# Patient Record
Sex: Female | Born: 1975 | Race: Black or African American | Hispanic: No | Marital: Married | Smoking: Never smoker
Health system: Southern US, Community
[De-identification: ages and names within clinical notes are randomized; demographics above are authoritative.]

## PROBLEM LIST (undated history)

## (undated) DIAGNOSIS — I10 Essential (primary) hypertension: Secondary | ICD-10-CM

## (undated) DIAGNOSIS — G43909 Migraine, unspecified, not intractable, without status migrainosus: Secondary | ICD-10-CM

---

## 2000-03-23 ENCOUNTER — Encounter: Payer: Self-pay | Admitting: Emergency Medicine

## 2000-03-23 ENCOUNTER — Emergency Department (HOSPITAL_COMMUNITY): Admission: EM | Admit: 2000-03-23 | Discharge: 2000-03-23 | Payer: Self-pay | Admitting: Emergency Medicine

## 2000-04-01 ENCOUNTER — Emergency Department (HOSPITAL_COMMUNITY): Admission: EM | Admit: 2000-04-01 | Discharge: 2000-04-01 | Payer: Self-pay

## 2000-06-22 ENCOUNTER — Ambulatory Visit (HOSPITAL_COMMUNITY): Admission: RE | Admit: 2000-06-22 | Discharge: 2000-06-22 | Payer: Self-pay | Admitting: *Deleted

## 2000-08-08 ENCOUNTER — Ambulatory Visit (HOSPITAL_COMMUNITY): Admission: RE | Admit: 2000-08-08 | Discharge: 2000-08-08 | Payer: Self-pay | Admitting: *Deleted

## 2000-09-29 ENCOUNTER — Inpatient Hospital Stay (HOSPITAL_COMMUNITY): Admission: AD | Admit: 2000-09-29 | Discharge: 2000-09-29 | Payer: Self-pay | Admitting: *Deleted

## 2001-01-03 ENCOUNTER — Inpatient Hospital Stay (HOSPITAL_COMMUNITY): Admission: AD | Admit: 2001-01-03 | Discharge: 2001-01-05 | Payer: Self-pay | Admitting: Obstetrics

## 2001-11-24 ENCOUNTER — Emergency Department (HOSPITAL_COMMUNITY): Admission: EM | Admit: 2001-11-24 | Discharge: 2001-11-24 | Payer: Self-pay | Admitting: Emergency Medicine

## 2001-11-24 ENCOUNTER — Encounter: Payer: Self-pay | Admitting: Emergency Medicine

## 2003-02-09 ENCOUNTER — Emergency Department (HOSPITAL_COMMUNITY): Admission: EM | Admit: 2003-02-09 | Discharge: 2003-02-09 | Payer: Self-pay | Admitting: Emergency Medicine

## 2003-02-09 ENCOUNTER — Encounter: Payer: Self-pay | Admitting: Emergency Medicine

## 2003-03-20 ENCOUNTER — Other Ambulatory Visit: Admission: RE | Admit: 2003-03-20 | Discharge: 2003-03-20 | Payer: Self-pay | Admitting: Family Medicine

## 2003-11-12 ENCOUNTER — Emergency Department (HOSPITAL_COMMUNITY): Admission: EM | Admit: 2003-11-12 | Discharge: 2003-11-12 | Payer: Self-pay | Admitting: Emergency Medicine

## 2003-11-14 ENCOUNTER — Emergency Department (HOSPITAL_COMMUNITY): Admission: EM | Admit: 2003-11-14 | Discharge: 2003-11-14 | Payer: Self-pay | Admitting: Emergency Medicine

## 2004-02-04 ENCOUNTER — Ambulatory Visit (HOSPITAL_COMMUNITY): Admission: RE | Admit: 2004-02-04 | Discharge: 2004-02-04 | Payer: Self-pay | Admitting: Obstetrics

## 2005-01-25 ENCOUNTER — Ambulatory Visit (HOSPITAL_COMMUNITY): Admission: RE | Admit: 2005-01-25 | Discharge: 2005-01-25 | Payer: Self-pay | Admitting: Obstetrics & Gynecology

## 2005-02-16 ENCOUNTER — Ambulatory Visit (HOSPITAL_COMMUNITY): Admission: RE | Admit: 2005-02-16 | Discharge: 2005-02-16 | Payer: Self-pay | Admitting: Obstetrics & Gynecology

## 2005-02-19 ENCOUNTER — Inpatient Hospital Stay (HOSPITAL_COMMUNITY): Admission: AD | Admit: 2005-02-19 | Discharge: 2005-02-19 | Payer: Self-pay | Admitting: Psychiatry

## 2005-05-12 ENCOUNTER — Inpatient Hospital Stay (HOSPITAL_COMMUNITY): Admission: AD | Admit: 2005-05-12 | Discharge: 2005-05-12 | Payer: Self-pay | Admitting: Obstetrics

## 2005-05-19 ENCOUNTER — Encounter (HOSPITAL_COMMUNITY): Admission: RE | Admit: 2005-05-19 | Discharge: 2005-05-19 | Payer: Self-pay | Admitting: Obstetrics

## 2005-05-24 ENCOUNTER — Inpatient Hospital Stay (HOSPITAL_COMMUNITY): Admission: AD | Admit: 2005-05-24 | Discharge: 2005-06-01 | Payer: Self-pay | Admitting: Obstetrics & Gynecology

## 2005-05-30 ENCOUNTER — Encounter (INDEPENDENT_AMBULATORY_CARE_PROVIDER_SITE_OTHER): Payer: Self-pay | Admitting: Specialist

## 2006-04-04 ENCOUNTER — Emergency Department (HOSPITAL_COMMUNITY): Admission: EM | Admit: 2006-04-04 | Discharge: 2006-04-04 | Payer: Self-pay | Admitting: Emergency Medicine

## 2006-07-30 ENCOUNTER — Emergency Department (HOSPITAL_COMMUNITY): Admission: EM | Admit: 2006-07-30 | Discharge: 2006-07-30 | Payer: Self-pay | Admitting: Emergency Medicine

## 2006-09-19 ENCOUNTER — Ambulatory Visit (HOSPITAL_COMMUNITY): Admission: RE | Admit: 2006-09-19 | Discharge: 2006-09-19 | Payer: Self-pay | Admitting: Obstetrics

## 2006-10-14 ENCOUNTER — Inpatient Hospital Stay (HOSPITAL_COMMUNITY): Admission: RE | Admit: 2006-10-14 | Discharge: 2006-10-15 | Payer: Self-pay | Admitting: Obstetrics

## 2007-01-01 ENCOUNTER — Inpatient Hospital Stay (HOSPITAL_COMMUNITY): Admission: AD | Admit: 2007-01-01 | Discharge: 2007-01-01 | Payer: Self-pay | Admitting: Obstetrics

## 2007-01-10 ENCOUNTER — Inpatient Hospital Stay (HOSPITAL_COMMUNITY): Admission: AD | Admit: 2007-01-10 | Discharge: 2007-01-12 | Payer: Self-pay | Admitting: Obstetrics

## 2007-02-15 ENCOUNTER — Inpatient Hospital Stay (HOSPITAL_COMMUNITY): Admission: AD | Admit: 2007-02-15 | Discharge: 2007-02-20 | Payer: Self-pay | Admitting: Obstetrics

## 2007-02-28 ENCOUNTER — Inpatient Hospital Stay (HOSPITAL_COMMUNITY): Admission: AD | Admit: 2007-02-28 | Discharge: 2007-02-28 | Payer: Self-pay | Admitting: Obstetrics

## 2007-03-04 ENCOUNTER — Inpatient Hospital Stay (HOSPITAL_COMMUNITY): Admission: AD | Admit: 2007-03-04 | Discharge: 2007-03-08 | Payer: Self-pay | Admitting: Obstetrics & Gynecology

## 2007-03-05 ENCOUNTER — Encounter: Payer: Self-pay | Admitting: Obstetrics & Gynecology

## 2008-01-23 ENCOUNTER — Inpatient Hospital Stay (HOSPITAL_COMMUNITY): Admission: AD | Admit: 2008-01-23 | Discharge: 2008-01-23 | Payer: Self-pay | Admitting: Obstetrics & Gynecology

## 2008-06-04 ENCOUNTER — Other Ambulatory Visit: Admission: RE | Admit: 2008-06-04 | Discharge: 2008-06-04 | Payer: Self-pay | Admitting: Family Medicine

## 2010-01-01 ENCOUNTER — Other Ambulatory Visit: Admission: RE | Admit: 2010-01-01 | Discharge: 2010-01-01 | Payer: Self-pay | Admitting: Family Medicine

## 2010-08-24 ENCOUNTER — Emergency Department (HOSPITAL_COMMUNITY)
Admission: EM | Admit: 2010-08-24 | Discharge: 2010-08-24 | Payer: Self-pay | Source: Home / Self Care | Admitting: Emergency Medicine

## 2010-10-11 ENCOUNTER — Encounter: Payer: Self-pay | Admitting: Obstetrics & Gynecology

## 2011-02-02 NOTE — H&P (Signed)
NAME:  Elizabeth [name], Elizabeth [name]             ACCOUNT NO.:  [id number]   MEDICAL RECORD NO.:  [id number]          PATIENT TYPE:  INP   LOCATION:  9173                          FACILITY:  [facility]   PHYSICIAN:  Roseanna Rainbow, M.D.DATE OF BIRTH:  06-Mar-1976   DATE OF ADMISSION:  03/03/2007  DATE OF DISCHARGE:                              HISTORY & PHYSICAL   CHIEF COMPLAINT:  The patient is a 35 year old para 4 with an estimated  date of confinement of July 20 with twin gestation at 34 weeks, with a  history of an incompetent cervix with cerclage in situ, complaining of  contractions and leaking fluid.   HISTORY OF PRESENT ILLNESS:  Please see the above.  The patient has had  multiple admissions during the pregnancy for threatened preterm labor.  She has also had pelvic pain and musculoskeletal pain.  On the last  several ultrasounds the presenting twin is breech presentation.   ALLERGIES:  No known drug allergies.   MEDICATIONS:  Please see the reconciliation form.   OB RISK FACTORS:  Please see the above history, with a previous cesarean  delivery.  History of gestational diabetes with previous pregnancy.  History of cervical insufficiency.  A sore was cultured in December 2007  and was positive for MRSA.  Questionable chronic hypertension.   PRENATAL SCREENINGS:  Three-hour GTT normal.  Quad screen negative.  Hematocrit 33.6, hemoglobin 10.3, platelets 336,000.  Chlamydia  negative.  Urine culture and sensitivity:  No growth.  One-hour GCT 147.  GC negative.  Hepatitis B surface antigen negative.  HIV nonreactive.  Blood type is A positive, antibody screen negative.  RPR nonreactive.  Rubella immune.  Sickle cell negative.   PAST OBSTETRICAL HISTORY:  There is a history of two spontaneous  abortions.  There are three living children.  There is a history of a  previous cesarean delivery.   PAST MEDICAL HISTORY:  Please see the above.   PAST SURGICAL HISTORY:  Please see the above  cerclage placement.   SOCIAL HISTORY:  Nonsmoker.  She denies alcohol use or drug use.   PHYSICAL EXAMINATION:  VITAL SIGNS:  Blood pressure 143/94, temperature  98, respirations 22.  Fetal heart tracings reassuring x2.  Tocodynamometer:  Uterine contractions every 2-5 minutes.  GENERAL:  Uncomfortable.  ABDOMEN:  Gravid, nontender.  PELVIC:  Sterile vaginal exam per the RN:  The cervix is fingertip and  50%.  Ferning and Nitrazine negative.   ASSESSMENT:  1. Twin gestation at 34+ weeks with cervical insufficiency and      cerclage in place.  2. Threatened preterm labor.  3. Questionable chronic hypertension.   PLAN:  1. Observation for now.  2. Subcutaneous terbutaline as needed.  3. IV hydration.  4. Monitor closely.  5. Check PIH panel.  6. Limited OB ultrasound for amniotic fluid index x2.      Roseanna Rainbow, M.D.  Electronically Signed     LAJ/MEDQ  D:  03/04/2007  T:  03/04/2007  Job:  [id number]

## 2011-02-02 NOTE — Discharge Summary (Signed)
Elizabeth [name], Elizabeth [name]             ACCOUNT NO.:  [id number]   MEDICAL RECORD NO.:  [id number]          PATIENT TYPE:  INP   LOCATION:  9123                          [facility]ACILITY:  [facility]   PHYSICIAN:  Charles A. Clearance Coots, M.D.DATE OF BIRTH:  Oct 30, 1975   DATE OF ADMISSION:  03/03/2007  DATE OF DISCHARGE:  03/08/2007                               DISCHARGE SUMMARY   ADMISSION DIAGNOSES:  1. A 34-35 weeks' gestation, twins.  2. Cervical insufficiency with cerclage in place.  3. Preterm labor.  4. Desires sterilization.   DISCHARGE DIAGNOSES:  1. A 34-35 weeks' gestation, twins.  2. Cervical insufficiency with cerclage in place.  3. Preterm labor.  4. Desires sterilization.  5. Status post repeat low transverse Cesarean section and bilateral      partial salpingectomy on March 05, 2007.   Viable twins were delivered. Baby A at 0921; Apgars of 9 at one minute  and 9 at five minutes; weight of 2250 grams; length of 44.5 cm. Baby B  at 0922; Apgars of 8 at one minute and 9 at five minutes; weight of 2775  grams; length of 51 cm. Mother and infants discharged home in good  condition.   REASON FOR ADMISSION:  A 35 year old, para 4 with estimated date of  confinement of April 09, 2007, twin gestation at 23 weeks' gestation,  history of incompetent cervix with cerclage in place, presented to  [facility] with complaint of uterine contractions and leaking  fluid. The patient has had multiple admissions during the pregnancy for  preterm labor. Over the past several weeks, she has had increased pelvic  pain and musculoskeletal pain. The last ultrasound a few days prior to  admission revealed twin gestation with Twin A being breech.   PAST MEDICAL HISTORY:  Surgery:  Cesarean section, cerclage placement.  Illnesses:  Urinary tract infections, increased blood pressure.   MEDICATIONS:  1. Prenatal vitamins.  2. Ambien.   ALLERGIES:  No known drug allergies.   SOCIAL HISTORY:  Negative  tobacco, alcohol or recreational drug use.   PHYSICAL EXAMINATION:  Well-nourished, well-developed female in no acute  distress. Blood pressure 143/94, temperature 98.  LUNGS:  Were clear to auscultation bilaterally.  HEART:  Regular rate and rhythm.  ABDOMEN:  Gravid, nontender.  CERVIX:  Fingertip dilated, 50% effaced and cerclage was felt to be  intact. On sterile speculum exam, negative Nitrazine and negative fern.   ADMITTING LABORATORY VALUES:  Hemoglobin 10, hematocrit 31, white blood  cell count 10,000, platelets 325,000. Comprehensive metabolic panel was  within normal limits.   HOSPITAL COURSE:  The patient was admitted and placed on expected  management. She continued to have uterine contractions, and a decision  was made to proceed with cesarean section delivery for twins at 34  weeks. Baby A breech, threatened preterm labor. The patient desired  permanent sterilization also. A repeat low transverse Cesarean section  and bilateral salpingectomy was performed on March 05, 2007. There were  no intraoperative complications. Postoperative course was uncomplicated.  The patient was discharged home on postoperative day #3 in good  condition.   DISCHARGE  LABORATORY VALUES:  Hemoglobin 8, hematocrit 26, white blood  cell count 10,000, platelets 278,000.   DISCHARGE DISPOSITION:  MEDICATIONS:  Tylox and ibuprofen were  prescribed for pain. Repliva was prescribed for anemia. The patient is  to continue prenatal vitamins.   Routine written instructions were given for discharge after cesarean  section. The patient is to call office for followup appointment in 2  weeks.      Charles A. Clearance Coots, M.D.  Electronically Signed     CAH/MEDQ  D:  03/08/2007  T:  03/08/2007  Job:  [id number]

## 2011-02-02 NOTE — Discharge Summary (Signed)
NAMESEBASTIAN, DZIK             ACCOUNT NO.:  [id number]   MEDICAL RECORD NO.:  [id number]          PATIENT TYPE:  INP   LOCATION:  9149                          [facility]ACILITY:  [facility]   PHYSICIAN:  Charles A. Clearance Coots, M.D.DATE OF BIRTH:  Oct 15, 1975   DATE OF ADMISSION:  02/15/2007  DATE OF DISCHARGE:  02/20/2007                               DISCHARGE SUMMARY   ADMITTING DIAGNOSES:  32 weeks' gestation, twins, cervical incompetence  with cervical cerclage in place, severe pelvic pain and pressure.   DISCHARGE DIAGNOSES:  Same.  Symptoms of pelvic pain and pressure  relieved by increased bedrest.  Patient discharged home at 33 weeks'  gestation undelivered, and much improved to be placed on modified  bedrest at home.   REASON FOR ADMISSION:  35 year old black female G5, P3, estimated date  of confinement of 04/09/2007, twin gestation presented to the office  with severe pelvic pain and pressure and difficulty walking.  The  patient was admitted to the hospital for bedrest.  She has an  obstetrical history that is significant for cervical incompetence and  cerclage was placed the first trimester at approximately 13 weeks'  gestation.  She had done well with her cerclage, but has had increasing  pelvic pain and pressure over the last week now to the point where it  was difficult to ambulate.   PAST MEDICAL HISTORY:  Surgery: Cesarean section.  Illnesses none.  Medication: Prenatal vitamins.   ALLERGIES:  NO KNOWN DRUG ALLERGIES.   SOCIAL HISTORY:  Married.  Negative tobacco, alcohol or recreational  drug use.   PHYSICAL EXAMINATION:  GENERAL:  Obese black female in no acute  distress.  VITAL SIGNS:  She is afebrile, vital signs stable.  LUNGS:  Clear to auscultation bilaterally.  HEART:  Regular rate and rhythm.  ABDOMEN:  Gravid, nontender.  Cervix long, closed, cerclage intact.  External fetal monitoring was reassuring.   ADMITTING LABORATORY VALUES:  Comprehensive metabolic  panel was within  normal limits.  Hemoglobin was 10, hematocrit 30, white blood cell count  8700, platelets 343,000.   HOSPITAL COURSE:  The patient was admitted and placed on bedrest with  bathroom privileges.  She responded well to bedrest with decreased  pelvic pain and pressure.  The patient was instructed that this is the  type of bedrest that she should maintain at home and by hospital day #4  the patient was ready to be discharged home.   DISCHARGE DISPOSITION:  Medications: Continue prenatal vitamins.  Continue weekly progesterone injections.  Routine written instructions  were given for preterm labor and cervical incompetence with twins.  The  patient is to call our office for a follow-up appointment.      Charles A. Clearance Coots, M.D.  Electronically Signed     CAH/MEDQ  D:  02/20/2007  T:  02/20/2007  Job:  [id number]

## 2011-02-02 NOTE — Op Note (Signed)
Elizabeth [name], Elizabeth [name]             ACCOUNT NO.:  [id number]   MEDICAL RECORD NO.:  [id number]          PATIENT TYPE:  INP   LOCATION:  9123                          [facility]AC[facility]   PHYSICIAN:  Roseanna Rainbow, M.D.DATE OF BIRTH:  06-19-1976   DATE OF PROCEDURE:  03/05/2007  DATE OF DISCHARGE:                               OPERATIVE REPORT   PREOPERATIVE DIAGNOSIS:  Twin gestation at 35 weeks, cervical  insufficiency, threatened preterm labor.  Twin A breech, desires  sterilization, history of previous cesarean delivery.   POSTOPERATIVE DIAGNOSIS:  Twin gestation at 35 weeks, cervical  insufficiency, threatened preterm labor.  Twin A breech, desires  sterilization, history of previous cesarean delivery.  Adhesions.   PROCEDURES:  Repeat low transverse cesarean delivery via Pfannenstiel  skin incision, bilateral tubal ligation, lysis of adhesions, revision of  previous scar.   SURGEON:  Dr. Tamela Oddi   ANESTHESIA:  Spinal.   COMPLICATIONS:  None.   ESTIMATED BLOOD LOSS:  800 mL.   IV FLUIDS:   URINE OUTPUT:  As per anesthesiology.   PROCEDURE:  The patient is taken to the operating room with an IV  running.  A spinal anesthetic was placed without difficulty.  She was  placed in the dorsal supine position with a leftward tilt and prepped  and draped in the usual sterile fashion.  A Pfannenstiel skin incision  was then made with a scalpel through the previous scar and carried down  to the underlying fascia.  The fascia was nicked in the midline.  The  fascial incision was then extended bilaterally with curved Mayo  scissors.  The superior aspect of the fascial incision was tented up and  the underlying rectus muscles dissected off.  The inferior aspect of the  fascial incision was manipulated in a similar fashion.  The parietal  peritoneum was tented up and entered sharply.  This incision was then  extended superiorly and inferiorly with good visualization of  the  bladder.  There was a dense adhesion involving the uterine fundus to the  parietal peritoneum.  This adhesion was divided with the Bovie.  The  bladder blade was then placed.  The vesicouterine peritoneum was tented  up and entered sharply.  This incision was then extended bilaterally and  the bladder flap created sharply.  The bladder blade was replaced.  The  lower uterine segment was incised in a transverse fashion with a  scalpel.  The uterine incision was then extended bluntly.  The complete  breech extraction was then performed for twin A.  The cord was clamped  and cut.  The infant was handed off to the awaiting neonatologist.  The  twin B was delivered as a vertex presentation.  The head was delivered  atraumatically.  The oropharynx was suctioned with bulb suction.  The  cord was clamped and cut.  The infant was handed off to the waiting  neonatologist.  Placenta was then removed.  The intrauterine cavity was  evacuated of any remaining amniotic fluid, clots and debris with moist  laparotomy sponge.  At this point the uterus was exteriorized.  The mid  isthmic portion of the left fallopian tube was then grasped with a  Babcock clamp.  A 2 cm segment of tube was doubly ligated with 0 plain  and excised.  The mid isthmic portion of the right tube was then grasped  with a Babcock clamp.  A window was made in the mesosalpinx.  A 2 cm  segment of tube was then ligated and excised.  Adequate hemostasis was  noted.  The uterus was returned to the abdomen.  The paracolic gutters  were irrigated.  The uterine incision was felt to be hemostatic.  To  facilitate closure of the parietal peritoneum adhesions involving the  omentum to the parietal peritoneum were then divided using the Bovie.  Individual bleeding points on the omentum were cauterized with Bovie.  Adequate hemostasis noted.  The parietal peritoneum was then  reapproximated in a running fashion using 2-0 Vicryl.  The fascia  was  closed in a running fashion using 0 PDS.  At this point the scar was  then excised from the previous cesarean delivery.  The individual  bleeding points in the subcutaneous fat layer were cauterized with the  Bovie.  The skin was closed in a subcuticular fashion using a running  suture of 3-0 Monocryl.  Dermabond was then applied.  At this point the  patient was placed in the dorsal lithotomy position.  A bivalve speculum  was placed in the patient's vagina.  The cerclage knot was grasped with  an empty ring sponge stick and the suture was transected.  The cerclage  was felt to be removed completely.  2 grams of cephazolin had been given  at cord clamp. At the close of the procedure the instrument and pack  counts were said to be correct x2.  The patient was taken to the PACU  awake and in stable condition.      Roseanna Rainbow, M.D.  Electronically Signed     LAJ/MEDQ  D:  03/05/2007  T:  03/05/2007  Job:  [id number]

## 2011-02-05 NOTE — Op Note (Signed)
[name], [name]             ACCOUNT NO.:  [id number]   MEDICAL RECORD NO.:  [id number]          PATIENT TYPE:  AMB   LOCATION:  SDC                           [facility]   PHYSICIAN:  Charles A. Clearance Coots, M.D.DATE OF BIRTH:  1976-02-06   DATE OF PROCEDURE:  10/13/2006  DATE OF DISCHARGE:                               OPERATIVE REPORT   PREOPERATIVE DIAGNOSES:  1. Fourteen weeks' gestation, twins.  2. Incompetent cervix.   POSTOPERATIVE DIAGNOSES:  1. Fourteen weeks' gestation, twins.  2. Incompetent cervix.   PROCEDURE:  McDonald cervical cerclage.   SURGEON:  Coral Ceo, MD   ANESTHESIA:  Spinal.   ESTIMATED BLOOD LOSS:  Minimal.   COMPLICATIONS:  None.   SPECIMEN:  None.   OPERATION:  The patient was brought to the operating room and after  satisfactory spinal anesthesia, the legs were brought up in stirrups and  the vagina was prepped and draped in the usual sterile fashion.  The  urinary bladder was emptied of approximately 50 mL of clear urine.  A  sterile weighted speculum was inserted in the posterior vaginal vault.  The cervix was isolated with the aid of a Occupational hygienist.  The  anterior lip of the cervix was grasped with a small ring forceps.  A  McDonald cervical cerclage was then placed in routine fashion with #4  silk suture and medium Mayo needle that was double-threaded.  The suture  was placed in a counterclockwise fashion starting at 12 o'clock and  ending at 12 o'clock.  The suture was then snugged securely and tied  down with several knots and cut approximately 1 inch for future  identification and removal of the suture.  There was no active bleeding  or leaking of fluid at the conclusion of the procedure.  All instruments  were retired.  The patient tolerated the procedure well and was  transported to the recovery room in satisfactory condition.      Charles A. Clearance Coots, M.D.  Electronically Signed     CAH/MEDQ  D:  10/13/2006  T:   10/13/2006  Job:  [id number]

## 2011-02-05 NOTE — Discharge Summary (Signed)
Elizabeth [name], Elizabeth [name]               ACCOUNT NO.:  [id number]   MEDICAL RECORD NO.:  [id number]          PATIENT TYPE:  INP   LOCATION:  9305                          [facility]ACILITY:  [facility]   PHYSICIAN:  Charles A. Clearance Coots, M.D.DATE OF BIRTH:  04-26-1976   DATE OF ADMISSION:  05/24/2005  DATE OF DISCHARGE:  06/01/2005                                 DISCHARGE SUMMARY   ADMITTING DIAGNOSES:  1.  A 21-week gestation.  2.  Preterm labor.   DISCHARGE DIAGNOSES:  1.  A 21-week gestation.  2.  Preterm labor.  3.  Status post normal spontaneous vaginal delivery of nonviable twin A on      May 27, 2005.  4.  Status post delivery of nonviable twin B on May 30, 2005.  5.  Preterm labor and delivery most likely due to infection.  6.  Patient was discharged home in good condition after intravenous      antibiotic therapy.   REASON FOR ADMISSION:  A 35 year old black female G3, P1 last menstrual  period on December 17, 2004.  Estimated date of confinement of October 04, 2005.  Presented with twin gestation at [redacted] weeks gestation with a complaint  of cramping and leaking fluid for two days.  Patient has a history of  preterm delivery with cesarean section delivery with previous pregnancy.   PAST MEDICAL HISTORY:   SURGERY:  Cesarean section.   ILLNESSES:  None.   MEDICATIONS:  Prenatal vitamins, Delalutin weekly.   ALLERGIES:  No known drug allergies.   PHYSICAL EXAMINATION:  GENERAL:  Obese black female in no acute distress.  VITAL SIGNS:  Temperature 99.5, pulse 89, respiratory rate 20, blood  pressure 124/58, height 5 feet 5-1/2 inches, weight 250 pounds.  LUNGS:  Clear to auscultation bilaterally.  HEART:  Regular rate and rhythm.  ABDOMEN:  Gravid, obese, nontender.  GENITOURINARY:  Cervix 3 cm with membranes bulging at the os of the cervix.   IMPRESSION:  1.  Twin gestation at [redacted] weeks gestation.  2.  Preterm labor.   ADMISSION LABORATORIES:  Hemoglobin 10.6, hematocrit  31.8, white blood cell  count 15,500, platelets 337,000.  Wet prep too numerous to count white blood  cells, too numerous to count clue cells.  Group B Strep culture was done.  Coags were within normal limits.  Comprehensive metabolic panel was within  normal limits except for elevated glucose 176.   HOSPITAL COURSE:  Patient was admitted and started on magnesium sulfate  tocolysis along with IV antibiotic therapy.  She continued to have uterine  contractions and had spontaneous rupture of membranes on May 27, 2005.  Magnesium sulfate was discontinued and the patient progressed to spontaneous  vaginal delivery of a nonviable twin on May 27, 2005.  The umbilical  cord was cut at the introitus and patient was continued on IV antibiotic  therapy and had stopped contracting, but resumed contractions and developed  elevated temperature and progressed to spontaneous vaginal delivery of the  second nonviable twin on May 30, 2005.  There were no complications  with either delivery.  Patient was  continued on IV antibiotic therapy for  presumptive chorioamnionitis.  She had no further complications of  postpartum except for some labile emotional changes and anemia.  Patient was  discharged home on postpartum day #2 in good condition.   DISCHARGE LABORATORIES:  Hemoglobin 7.9, hematocrit 23.6, white blood cell  count 19,900, platelets 371,000.   DISCHARGE DISPOSITION:   MEDICATIONS:  1.  Ibuprofen was prescribed for pain.  2.  Continue prenatal vitamins.  3.  Iron was also prescribed for anemia.   Patient was given instructions per booklet for obstetrical discharge after  delivery and she is to call office for a follow-up appointment in two weeks.      Charles A. Clearance Coots, M.D.  Electronically Signed     CAH/MEDQ  D:  06/01/2005  T:  06/01/2005  Job:  [id number]

## 2011-02-05 NOTE — Discharge Summary (Signed)
Elizabeth [name], Elizabeth [name]             ACCOUNT NO.:  [id number]   MEDICAL RECORD NO.:  [id number]          PATIENT TYPE:  INP   LOCATION:  9314                          [facility]ACILITY[facility]   PHYSICIAN:  Charles A. Clearance Coots, M.D.DATE OF BIRTH:  28-Mar-1976   DATE OF ADMISSION:  10/13/2006  DATE OF DISCHARGE:  10/15/2006                               DISCHARGE SUMMARY   ADMITTING DIAGNOSES:  1. Fourteen weeks gestation, twins.  2. Incompetent cervix.   DISCHARGE DIAGNOSES:  1. Fourteen weeks gestation, twins.  2. Incompetent cervix.   Discharged home, undelivered at 64 weeks' gestation after McDonald  cervical cerclage.   REASON FOR ADMISSION:  A 35 year old G5, P 2-0-2-3, estimated date of  confinement of April 09, 2007, history of twins with previous pregnancies  and preterm cervical changes, was told that she would need a cerclage  for any future pregnancies by her obstetrician for cervical incompetence  with previous pregnancy   PAST MEDICAL HISTORY:   SURGERY:  Cesarean section in 1999.   ILLNESSES:  None.   MEDICATIONS:  Prenatal vitamins.   ALLERGIES:  NO KNOWN DRUG ALLERGIES   SOCIAL HISTORY:  Married.  Negative tobacco, alcohol or recreational  drug use.   PHYSICAL EXAMINATION:  GENERAL:  Well-nourished, well-developed female  in no acute distress.  VITAL SIGNS:  Temperature 97.9, pulse 69, respiratory rate 16, blood  pressure 106/91.  LUNGS:  Clear to auscultation bilaterally.  HEART:  Regular rate and rhythm.  ABDOMEN:  Nontender.  PELVIC:  Cervix long, closed.   ADMITTING LABORATORY VALUES:  Hemoglobin 11.7, hematocrit 34.8, white  blood cell count 9,800, platelets 394,000.   HOSPITAL COURSE:  The patient underwent a McDonald cervical cerclage on  October 13, 2006.  There were no intraoperative complications.  The  patient had back pain and some cramping on post-op day #1, and was kept  for another 24 hours observation.  She was feeling quite well on post-op  day  #2, without any back or abdominal pain and was therefore discharged  home.   DISCHARGE DISPOSITION:   MEDICATIONS:  Continue prenatal vitamins.   Routine instructions were given for bedrest at home.   The patient is to call the office for a followup appointment in 2 weeks.      Charles A. Clearance Coots, M.D.  Electronically Signed     CAH/MEDQ  D:  10/15/2006  T:  10/15/2006  Job:  [id number]

## 2011-06-16 LAB — WET PREP, GENITAL
Clue Cells Wet Prep HPF POC: NONE SEEN
Trich, Wet Prep: NONE SEEN
Yeast Wet Prep HPF POC: NONE SEEN

## 2011-07-07 LAB — CBC
HCT: 26.6 — ABNORMAL LOW
MCHC: 32.7
MCV: 80.4
Platelets: 278

## 2011-07-07 LAB — SAMPLE TO BLOOD BANK

## 2011-07-08 LAB — LACTATE DEHYDROGENASE: LDH: 137

## 2011-07-08 LAB — COMPREHENSIVE METABOLIC PANEL
ALT: 27
AST: 28
Albumin: 2.4 — ABNORMAL LOW
Alkaline Phosphatase: 453 — ABNORMAL HIGH
BUN: 6
Calcium: 8.5
Creatinine, Ser: 0.69
GFR calc non Af Amer: >60
Glucose, Bld: 107 — ABNORMAL HIGH
Potassium: 3.7
Total Bilirubin: 0.3

## 2011-07-08 LAB — CBC
HCT: 31 — ABNORMAL LOW
Hemoglobin: 10.1 — ABNORMAL LOW
Hemoglobin: 9.8 — ABNORMAL LOW
MCHC: 32.5
MCHC: 32.7
MCV: 79.2
RBC: 3.77 — ABNORMAL LOW
RBC: 3.89
RDW: 18.3 — ABNORMAL HIGH
WBC: 10.9 — ABNORMAL HIGH

## 2011-07-08 LAB — URIC ACID: Uric Acid, Serum: 4.8

## 2012-10-18 ENCOUNTER — Other Ambulatory Visit: Payer: Self-pay | Admitting: Neurology

## 2012-10-18 DIAGNOSIS — G932 Benign intracranial hypertension: Secondary | ICD-10-CM

## 2012-10-27 ENCOUNTER — Other Ambulatory Visit: Payer: Self-pay

## 2013-08-03 ENCOUNTER — Emergency Department (HOSPITAL_COMMUNITY)
Admission: EM | Admit: 2013-08-03 | Discharge: 2013-08-03 | Disposition: A | Discharge status: Home / Self Care | Payer: Self-pay | Attending: Emergency Medicine | Admitting: Emergency Medicine

## 2013-08-03 ENCOUNTER — Encounter (HOSPITAL_COMMUNITY): Payer: Self-pay | Admitting: Emergency Medicine

## 2013-08-03 DIAGNOSIS — R55 Syncope and collapse: Secondary | ICD-10-CM | POA: Insufficient documentation

## 2013-08-03 DIAGNOSIS — I498 Other specified cardiac arrhythmias: Secondary | ICD-10-CM | POA: Insufficient documentation

## 2013-08-03 DIAGNOSIS — Z3202 Encounter for pregnancy test, result negative: Secondary | ICD-10-CM | POA: Insufficient documentation

## 2013-08-03 DIAGNOSIS — I10 Essential (primary) hypertension: Secondary | ICD-10-CM | POA: Insufficient documentation

## 2013-08-03 DIAGNOSIS — G43909 Migraine, unspecified, not intractable, without status migrainosus: Secondary | ICD-10-CM | POA: Insufficient documentation

## 2013-08-03 HISTORY — DX: Essential (primary) hypertension: I10

## 2013-08-03 HISTORY — DX: Migraine, unspecified, not intractable, without status migrainosus: G43.909

## 2013-08-03 LAB — POCT I-STAT, CHEM 8
BUN: 10 mg/dL (ref 6–23)
Calcium, Ion: 1.18 mmol/L (ref 1.12–1.23)
Chloride: 103 mEq/L (ref 96–112)
Creatinine, Ser: 0.9 mg/dL (ref 0.50–1.10)
Glucose, Bld: 80 mg/dL (ref 70–99)

## 2013-08-03 MED ORDER — SODIUM CHLORIDE 0.9 % IV BOLUS (SEPSIS)
1000.0000 mL | Freq: Once | INTRAVENOUS | Status: AC
  Administered 2013-08-03: 1000 mL via INTRAVENOUS

## 2013-08-03 MED ORDER — BUTALBITAL-APAP-CAFFEINE 50-325-40 MG PO TABS: 1.0000 | ORAL_TABLET | Freq: Four times a day (QID) | ORAL | Status: AC | PRN

## 2013-08-03 MED ORDER — DIPHENHYDRAMINE HCL 50 MG/ML IJ SOLN
12.5000 mg | Freq: Once | INTRAMUSCULAR | Status: AC
  Administered 2013-08-03: 12.5 mg via INTRAVENOUS
  Filled 2013-08-03: qty 1

## 2013-08-03 MED ORDER — PROCHLORPERAZINE EDISYLATE 5 MG/ML IJ SOLN
10.0000 mg | Freq: Four times a day (QID) | INTRAMUSCULAR | Status: DC | PRN
  Administered 2013-08-03: 10 mg via INTRAVENOUS
  Filled 2013-08-03: qty 2

## 2013-08-03 NOTE — ED Provider Notes (Signed)
CSN: [id number]     Arrival date & time 08/03/13  1402 History   First MD Initiated Contact with Patient 08/03/13 1505     Chief Complaint  Patient presents with  . Migraine  . Loss of Consciousness   (Consider location/radiation/quality/duration/timing/severity/associated sxs/prior Treatment) HPI Comments: The patient is a 37 year-old female with a past medical history of HTN and headaches presenting the Emergency Department with a chief complaint of migraine for 1 day.  She reports a gradual onset of a headache after waking up this morning rated a 9/10.  She reports a constant headache is frontal and bilateral retro orbital.  She reports associated photophobia without phonophobia.  She reports taking Excedrin with partial relief. While at work the patient states she went to lay down in a dark and qite room, upon standing and taking a few steps she reported lightheadedness and a syncopal event.  The patient's boyfriend states it was a witnessed event and she "fell backward and slid down the door".  He reports the patient did not hit her head. No bowl or bladder incontinence, no shaking, no confusion after.  He reports the patient was able to talk and walk immediately after without ataxia or abnormal speech. The patient reports aggravating factors are light and relieving factors are laying in a quite room. She denies resent medication change, rash, CO exposure, neck stiffness. She denies fever or chills, abdominal pain, vomiting, diarrhea, constipation. Patient's last menstrual period was 07/11/2013. Currently she reports her pain 7/10 without nausea.      The history is provided by the patient and a friend.    Past Medical History  Diagnosis Date  . Hypertension   . Migraines    History reviewed. No pertinent past surgical history. No family history on file. History  Substance Use Topics  . Smoking status: Never Smoker   . Smokeless tobacco: Not on file  . Alcohol Use: Yes   OB  History   Grav Para Term Preterm Abortions TAB SAB Ect Mult Living                 Review of Systems  All other systems reviewed and are negative.    Allergies  Review of patient's allergies indicates not on file.  Home Medications   Current Outpatient Rx  Name  Route  Sig  Dispense  Refill  . aspirin-acetaminophen-caffeine (EXCEDRIN MIGRAINE) 250-250-65 MG per tablet   Oral   Take 2 tablets by mouth every 6 (six) hours as needed for headache.          BP 161/90  Pulse 53  Temp(Src) 97.9 F (36.6 C) (Oral)  Resp 16  SpO2 99%  LMP 07/11/2013 Physical Exam  Nursing note and vitals reviewed. Constitutional: She is oriented to person, place, and time. She appears well-developed and well-nourished. No distress.  Patient laying comfortably in bed with the lights off.  HENT:  Head: Normocephalic and atraumatic.  Mouth/Throat: Oropharynx is clear and moist. No oropharyngeal exudate.  Eyes: EOM are normal. Pupils are equal, round, and reactive to light. Right eye exhibits no discharge. Left eye exhibits no discharge. No scleral icterus.  Neck: Normal range of motion. Neck supple. No JVD present. No rigidity.  Cardiovascular: Regular rhythm.  Bradycardia present.   Pulmonary/Chest: Effort normal and breath sounds normal. No respiratory distress. She has no wheezes. She has no rales.  Abdominal: Soft. Bowel sounds are normal. There is no tenderness. There is no rebound and no guarding.  Musculoskeletal:  She exhibits no edema.  Lymphadenopathy:    She has no cervical adenopathy.       Right cervical: No superficial cervical, no deep cervical and no posterior cervical adenopathy present.      Left cervical: No superficial cervical, no deep cervical and no posterior cervical adenopathy present.  Neurological: She is alert and oriented to person, place, and time. No cranial nerve deficit or sensory deficit. GCS eye subscore is 4. GCS verbal subscore is 5. GCS motor subscore is 6.   Photophobia present    ED Course  Procedures (including critical care time) Labs Review Labs Reviewed  POCT I-STAT, CHEM 8 - Abnormal; Notable for the following:    Hemoglobin 11.9 (*)    HCT 35.0 (*)    All other components within normal limits  GLUCOSE, CAPILLARY  POCT PREGNANCY, URINE   Imaging Review No results found.  EKG Interpretation   None       MDM  No diagnosis found.  Patient with a history of migraines presents after a syncopal event.  She describes a orthostatic syncopal event.  Orthostatics ordered, IV fluids, pain medication, labs ordered. CBG -90 1657 patient sleeping comfortably in room.  Reports symptoms are improving slowly.  Re-eval patient reports nausea and headache have improved. Discussed lab results and treatment plan with the patient.  She reports understanding and no other concerns at this time.    Patient is stable for discharge at this time.   Meds given in ED:  Medications  prochlorperazine (COMPAZINE) injection 10 mg (10 mg Intravenous Given 08/03/13 1542)  sodium chloride 0.9 % bolus 1,000 mL (1,000 mLs Intravenous New Bag/Given 08/03/13 1541)  diphenhydrAMINE (BENADRYL) injection 12.5 mg (12.5 mg Intravenous Given 08/03/13 1542)    New Prescriptions   No medications on file        Clabe Seal, PA-C 08/06/13 1714

## 2013-08-03 NOTE — ED Notes (Signed)
Bed: WA08 Expected date:  Expected time:  Means of arrival:  Comments: EMS-migraine/syncopal episode

## 2013-08-03 NOTE — Progress Notes (Signed)
[facility] provided pt with a list of primary care resources.  °

## 2013-08-03 NOTE — ED Provider Notes (Signed)
Pt presents with  A brief syncopal episode associated with a headache. No thunderclap onset.  No neck stiffness.  No confusion or seizure.  Pt is alert, smiling.   Able to walk to the bathroom.  Doubt SAH, meningitis.  Possible migraine headache with vasovagal episode.  Will check labs, monitor, treat for headache.      Medical screening examination/treatment/procedure(s) were conducted as a shared visit with non-physician practitioner(s) and myself.  I personally evaluated the patient during the encounter.   EKG Rate 56 Sinus rhythm Low voltage, precordial leads Borderline T abnormalities, anterior leads No significant change since last tracing   Celene Kras, MD 08/03/13 1545

## 2013-08-03 NOTE — ED Notes (Signed)
Per EMS, pt began having a migraine at 10AM this morning while at work. Pt took excedrin at 1030, which did not provide relief. Coworkers state the patient loss consciousness for a few seconds.  Pt has history of migraines and HTN. Pt was given 4mg  zofran for nausea in transit. Pt is sensitive to light.

## 2016-08-15 ENCOUNTER — Emergency Department (HOSPITAL_COMMUNITY)
Admission: EM | Admit: 2016-08-15 | Discharge: 2016-08-15 | Disposition: A | Discharge status: Home / Self Care | Payer: Self-pay | Attending: Emergency Medicine | Admitting: Emergency Medicine

## 2016-08-15 ENCOUNTER — Encounter (HOSPITAL_COMMUNITY): Payer: Self-pay | Admitting: Emergency Medicine

## 2016-08-15 DIAGNOSIS — G43009 Migraine without aura, not intractable, without status migrainosus: Secondary | ICD-10-CM | POA: Insufficient documentation

## 2016-08-15 DIAGNOSIS — G43909 Migraine, unspecified, not intractable, without status migrainosus: Secondary | ICD-10-CM

## 2016-08-15 DIAGNOSIS — Z9114 Patient's other noncompliance with medication regimen: Secondary | ICD-10-CM | POA: Insufficient documentation

## 2016-08-15 DIAGNOSIS — I10 Essential (primary) hypertension: Secondary | ICD-10-CM | POA: Insufficient documentation

## 2016-08-15 LAB — I-STAT BETA HCG BLOOD, ED (MC, WL, AP ONLY): I-stat hCG, quantitative: <5 m[IU]/mL (ref <5)

## 2016-08-15 MED ORDER — AMLODIPINE BESYLATE 5 MG PO TABS
10.0000 mg | ORAL_TABLET | Freq: Once | ORAL | Status: AC
  Administered 2016-08-15: 10 mg via ORAL
  Filled 2016-08-15: qty 2

## 2016-08-15 MED ORDER — AMLODIPINE BESYLATE 10 MG PO TABS: 10.0000 mg | ORAL_TABLET | Freq: Every day | ORAL | 0 refills | Status: AC

## 2016-08-15 MED ORDER — DIPHENHYDRAMINE HCL 50 MG/ML IJ SOLN
25.0000 mg | Freq: Once | INTRAMUSCULAR | Status: AC
  Administered 2016-08-15: 25 mg via INTRAVENOUS
  Filled 2016-08-15: qty 1

## 2016-08-15 MED ORDER — PROCHLORPERAZINE EDISYLATE 5 MG/ML IJ SOLN
10.0000 mg | Freq: Once | INTRAMUSCULAR | Status: AC
  Administered 2016-08-15: 10 mg via INTRAVENOUS
  Filled 2016-08-15: qty 2

## 2016-08-15 NOTE — ED Triage Notes (Signed)
Per [facility], pt from home, c/o sudden onsent of headache, 9/10, stood up to walk and got really dizzy and almost fell. Has not taken BP meds for about 3 weeks. No neuro deficits. Ambulatory. AAox4. Hx of HTN and migraines. Feels like a normal migraine to her.

## 2016-08-15 NOTE — ED Notes (Signed)
Pt verbalized understanding discharge instructions and denies any further needs or questions at this time. VS stable, ambulatory and steady gait.   

## 2016-08-15 NOTE — ED Provider Notes (Signed)
[facility]-EMERGENCY DEPT Provider Note   CSN: [id number] Arrival date & time: 08/15/16  1751     History   Chief Complaint Chief Complaint  Patient presents with  . Headache    HPI Elizabeth [name] is a 40 y.o. female.  40 year old female with past medical history including hypertension and migraines who presents with headache. Earlier this afternoon after church, the patient had a sudden onset of headache associated with spots in her vision, photophobia, and dizziness when she stood up. She has had the exact same headaches previously and reports that her migraines usually start suddenly out of the blue. She states that she usually has headaches like these when she is not taking her medication and she ran out of her blood pressure medication 3 weeks ago. She normally takes 10 mg amlodipine daily. She took 800 mg ibuprofen earlier with no relief of her symptoms. She denies any focal weakness/numbness, nausea/vomiting, fevers, cough/cold symptoms, or recent illness. Pain is currently 9/10 in intensity and constant.   The history is provided by the patient.  Headache      Past Medical History:  Diagnosis Date  . Hypertension   . Migraines     There are no active problems to display for this patient.   History reviewed. No pertinent surgical history.  OB History    No data available       Home Medications    Prior to Admission medications   Medication Sig Start Date End Date Taking? Authorizing Provider  amLODipine (NORVASC) 10 MG tablet Take 1 tablet (10 mg total) by mouth daily. 08/15/16   Laurence Spatesachel Morgan Little, MD  aspirin-acetaminophen-caffeine (EXCEDRIN MIGRAINE) [phone] MG per tablet Take 2 tablets by mouth every 6 (six) hours as needed for headache.    Historical Provider, MD    Family History No family history on file.  Social History Social History  Substance Use Topics  . Smoking status: Never Smoker  . Smokeless tobacco: Not on file  . Alcohol use  Yes     Allergies   Patient has no known allergies.   Review of Systems Review of Systems  Neurological: Positive for headaches.   10 Systems reviewed and are negative for acute change except as noted in the HPI.   Physical Exam Updated Vital Signs BP 144/86   Pulse (!) 54   Temp 98.6 F (37 C) (Oral)   Resp 15   LMP 08/15/2016   SpO2 97%   Physical Exam  Constitutional: She is oriented to person, place, and time. She appears well-developed and well-nourished. No distress.  Awake, alert  HENT:  Head: Normocephalic and atraumatic.  Eyes: Conjunctivae and EOM are normal. Pupils are equal, round, and reactive to light.  Neck: Neck supple.  Cardiovascular: Normal rate, regular rhythm and normal heart sounds.   No murmur heard. Pulmonary/Chest: Effort normal and breath sounds normal. No respiratory distress.  Abdominal: Soft. Bowel sounds are normal. She exhibits no distension. There is no tenderness.  Musculoskeletal: She exhibits no edema.  Neurological: She is alert and oriented to person, place, and time. She has normal reflexes. No cranial nerve deficit. She exhibits normal muscle tone.  Fluent speech, normal finger-to-nose testing, negative pronator drift, no clonus Normal rapid alternating movements 5/5 strength and normal sensation x all 4 extremities  Skin: Skin is warm and dry.  Psychiatric: She has a normal mood and affect. Judgment and thought content normal.  Nursing note and vitals reviewed.    ED Treatments /  Results  Labs (all labs ordered are listed, but only abnormal results are displayed) Labs Reviewed  I-STAT BETA HCG BLOOD, ED ([facility], [facility], [facility] ONLY)    EKG  EKG Interpretation None       Radiology No results found.  Procedures Procedures (including critical care time)  Medications Ordered in ED Medications  diphenhydrAMINE (BENADRYL) injection 25 mg (25 mg Intravenous Given 08/15/16 1840)  prochlorperazine (COMPAZINE) injection 10 mg (10  mg Intravenous Given 08/15/16 1840)  amLODipine (NORVASC) tablet 10 mg (10 mg Oral Given 08/15/16 1840)     Initial Impression / Assessment and Plan / ED Course  I have reviewed the triage vital signs and the nursing notes.  Pertinent labs that were available during my care of the patient were reviewed by me and considered in my medical decision making (see chart for details).  Clinical Course    Patient with history of migraines and hypertension, off of amlodipine for 3 weeks, presents with headache that she states is exactly like previous migraine headaches. She was well-appearing on exam, mildly hypertensive at 167/92. Normal neurologic exam. Gave the patient Benadryl and Compazine as well as her usual dose of amlodipine.  On re-examination, pt reported her headache had resolved.  I have considered subarachnoid hemorrhage but feel this is less likely given the patient's normal neurologic exam and statement that this headache is like all of her previous headaches. She has no infectious symptoms. Emphasized the importance of compliance with her medication and follow-up with PCP for further refills. Gave prescription for amlodipine and extensively reviewed return precautions including any neurologic symptoms such as stroke like symptoms. Patient voiced understanding and was discharged in satisfactory condition.  Final Clinical Impressions(s) / ED Diagnoses   Final diagnoses:  Migraine without status migrainosus, not intractable, unspecified migraine type  Essential hypertension  Non compliance w medication regimen    New Prescriptions Discharge Medication List as of 08/15/2016  9:55 PM    START taking these medications   Details  amLODipine (NORVASC) 10 MG tablet Take 1 tablet (10 mg total) by mouth daily., Starting Sun 08/15/2016, Print         Laurence Spatesachel Morgan Little, MD 08/16/16 [phone]

## 2017-02-09 LAB — GLUCOSE, POCT (MANUAL RESULT ENTRY): POC GLUCOSE: 87 mg/dL (ref 70–99)

## 2019-03-16 ENCOUNTER — Other Ambulatory Visit: Payer: Self-pay

## 2019-03-16 ENCOUNTER — Emergency Department (HOSPITAL_COMMUNITY): Payer: HRSA Program

## 2019-03-16 ENCOUNTER — Encounter (HOSPITAL_COMMUNITY): Payer: Self-pay | Admitting: Emergency Medicine

## 2019-03-16 ENCOUNTER — Inpatient Hospital Stay (HOSPITAL_COMMUNITY)
Admission: EM | Admit: 2019-03-16 | Discharge: 2019-03-21 | DRG: 177 | Disposition: A | Discharge status: Home Health | Payer: HRSA Program | Attending: Internal Medicine | Admitting: Internal Medicine

## 2019-03-16 DIAGNOSIS — J9601 Acute respiratory failure with hypoxia: Secondary | ICD-10-CM | POA: Diagnosis present

## 2019-03-16 DIAGNOSIS — U071 COVID-19: Principal | ICD-10-CM | POA: Diagnosis present

## 2019-03-16 DIAGNOSIS — G43909 Migraine, unspecified, not intractable, without status migrainosus: Secondary | ICD-10-CM | POA: Diagnosis present

## 2019-03-16 DIAGNOSIS — Z6841 Body Mass Index (BMI) 40.0 and over, adult: Secondary | ICD-10-CM

## 2019-03-16 DIAGNOSIS — E669 Obesity, unspecified: Secondary | ICD-10-CM | POA: Diagnosis present

## 2019-03-16 DIAGNOSIS — I10 Essential (primary) hypertension: Secondary | ICD-10-CM | POA: Diagnosis present

## 2019-03-16 DIAGNOSIS — I248 Other forms of acute ischemic heart disease: Secondary | ICD-10-CM | POA: Diagnosis present

## 2019-03-16 DIAGNOSIS — R0902 Hypoxemia: Secondary | ICD-10-CM | POA: Diagnosis not present

## 2019-03-16 DIAGNOSIS — R7303 Prediabetes: Secondary | ICD-10-CM | POA: Diagnosis present

## 2019-03-16 DIAGNOSIS — J1289 Other viral pneumonia: Secondary | ICD-10-CM | POA: Diagnosis present

## 2019-03-16 DIAGNOSIS — E871 Hypo-osmolality and hyponatremia: Secondary | ICD-10-CM | POA: Diagnosis present

## 2019-03-16 DIAGNOSIS — E876 Hypokalemia: Secondary | ICD-10-CM | POA: Diagnosis present

## 2019-03-16 DIAGNOSIS — J1282 Pneumonia due to coronavirus disease 2019: Secondary | ICD-10-CM | POA: Diagnosis present

## 2019-03-16 LAB — BASIC METABOLIC PANEL
Anion gap: 12 (ref 5–15)
BUN: 6 mg/dL (ref 6–20)
CO2: 24 mmol/L (ref 22–32)
Calcium: 7.6 mg/dL — ABNORMAL LOW (ref 8.9–10.3)
Chloride: 95 mmol/L — ABNORMAL LOW (ref 98–111)
Creatinine, Ser: 1.22 mg/dL — ABNORMAL HIGH (ref 0.44–1.00)
GFR calc Af Amer: >60 mL/min (ref >60)
GFR calc non Af Amer: 55 mL/min — ABNORMAL LOW (ref >60)
Glucose, Bld: 122 mg/dL — ABNORMAL HIGH (ref 70–99)
Potassium: 3.3 mmol/L — ABNORMAL LOW (ref 3.5–5.1)
Sodium: 131 mmol/L — ABNORMAL LOW (ref 135–145)

## 2019-03-16 LAB — CBC
HCT: 32.6 % — ABNORMAL LOW (ref 36.0–46.0)
Hemoglobin: 9.3 g/dL — ABNORMAL LOW (ref 12.0–15.0)
MCH: 20.8 pg — ABNORMAL LOW (ref 26.0–34.0)
MCHC: 28.5 g/dL — ABNORMAL LOW (ref 30.0–36.0)
MCV: 72.9 fL — ABNORMAL LOW (ref 80.0–100.0)
Platelets: 338 10*3/uL (ref 150–400)
RBC: 4.47 MIL/uL (ref 3.87–5.11)
RDW: 18.3 % — ABNORMAL HIGH (ref 11.5–15.5)
WBC: 5.8 10*3/uL (ref 4.0–10.5)
nRBC: 0 % (ref 0.0–0.2)

## 2019-03-16 LAB — I-STAT BETA HCG BLOOD, ED (MC, WL, AP ONLY): I-stat hCG, quantitative: <5 m[IU]/mL (ref <5)

## 2019-03-16 LAB — TROPONIN I (HIGH SENSITIVITY)
Troponin I (High Sensitivity): 26 ng/L — ABNORMAL HIGH (ref <18)
Troponin I (High Sensitivity): 30 ng/L — ABNORMAL HIGH (ref <18)

## 2019-03-16 LAB — SARS CORONAVIRUS 2 BY RT PCR (HOSPITAL ORDER, PERFORMED IN ~~LOC~~ HOSPITAL LAB): SARS Coronavirus 2: POSITIVE — AB

## 2019-03-16 LAB — LACTIC ACID, PLASMA: Lactic Acid, Venous: 1.8 mmol/L (ref 0.5–1.9)

## 2019-03-16 MED ORDER — SODIUM CHLORIDE 0.9% FLUSH: 3.0000 mL | Freq: Once | INTRAVENOUS | Status: DC

## 2019-03-16 MED ORDER — SODIUM CHLORIDE 0.9 % IV BOLUS
1000.0000 mL | Freq: Once | INTRAVENOUS | Status: AC
  Administered 2019-03-16: 1000 mL via INTRAVENOUS

## 2019-03-16 MED ORDER — ACETAMINOPHEN 325 MG PO TABS
650.0000 mg | ORAL_TABLET | Freq: Once | ORAL | Status: AC | PRN
  Administered 2019-03-16: 650 mg via ORAL
  Filled 2019-03-16: qty 2

## 2019-03-16 MED ORDER — KETOROLAC TROMETHAMINE 30 MG/ML IJ SOLN
15.0000 mg | Freq: Once | INTRAMUSCULAR | Status: AC
  Administered 2019-03-16: 15 mg via INTRAVENOUS
  Filled 2019-03-16: qty 1

## 2019-03-16 NOTE — ED Notes (Signed)
Pt reports CP only w/ cough or movement.

## 2019-03-16 NOTE — ED Notes (Signed)
Dr. Vanita Panda aware of COVID +.

## 2019-03-16 NOTE — ED Provider Notes (Signed)
[facility] EMERGENCY DEPARTMENT Provider Note   CSN: [id number] Arrival date & time: 03/16/19  1738     History   Chief Complaint Chief Complaint  Patient presents with  . Shortness of Breath  . Fever    HPI Elizabeth [name] is a 43 y.o. female.     HPI This adult female presents with concern of flulike illness. Over the past 5 days patient has had fever, cough, myalgia, dyspnea. She notes that she is generally well aside from history of hypertension for which she takes her medication regularly. She has been taking appropriate precautions against the coronavirus. She has no sick family members. Over the past week she has been drinking copious amounts of Gatorade, taking ibuprofen, Tylenol for symptom control but feels progressively worse. She saw her physician 2 days ago, had coronavirus labs sent, results not yet available. Now, with persistent generalized weakness, she presents for evaluation. Past Medical History:  Diagnosis Date  . Hypertension   . Migraines     There are no active problems to display for this patient.   History reviewed. No pertinent surgical history.   OB History   No obstetric history on file.      Home Medications    Prior to Admission medications   Medication Sig Start Date End Date Taking? Authorizing Provider  amLODipine (NORVASC) 10 MG tablet Take 1 tablet (10 mg total) by mouth daily. 08/15/16   Little, Wenda Overland, MD  aspirin-acetaminophen-caffeine (EXCEDRIN MIGRAINE) [phone] MG per tablet Take 2 tablets by mouth every 6 (six) hours as needed for headache.    [provider]    Family History No family history on file.  Social History Social History   Tobacco Use  . Smoking status: Never Smoker  . Smokeless tobacco: Never Used  Substance Use Topics  . Alcohol use: Yes  . Drug use: Not on file     Allergies   Patient has no known allergies.   Review of Systems Review of  Systems  Constitutional:       Per HPI, otherwise negative  HENT:       Per HPI, otherwise negative  Respiratory:       Per HPI, otherwise negative  Cardiovascular:       Per HPI, otherwise negative  Gastrointestinal: Negative for vomiting.  Endocrine:       Negative aside from HPI  Genitourinary:       Neg aside from HPI   Musculoskeletal:       Per HPI, otherwise negative  Skin: Negative.   Neurological: Positive for weakness. Negative for syncope.     Physical Exam Updated Vital Signs BP 112/61   Pulse 71   Temp (!) 101.9 F (38.8 C) (Oral)   Resp (!) 24   Ht 5\' 6"  (1.676 m)   Wt 121.6 kg   LMP 02/21/2019 (Approximate)   SpO2 90%   BMI 43.26 kg/m   Physical Exam Vitals signs and nursing note reviewed.  Constitutional:      Appearance: She is well-developed. She is ill-appearing.     Comments: Uncomfortable appearing adult female awake and alert  HENT:     Head: Normocephalic and atraumatic.  Eyes:     Conjunctiva/sclera: Conjunctivae normal.  Cardiovascular:     Rate and Rhythm: Regular rhythm. Tachycardia present.  Pulmonary:     Effort: Pulmonary effort is normal. Tachypnea present. No respiratory distress.     Breath sounds: No stridor. Decreased breath sounds  present.  Abdominal:     General: There is no distension.  Skin:    General: Skin is warm and dry.  Neurological:     Mental Status: She is alert and oriented to person, place, and time.     Cranial Nerves: No cranial nerve deficit.      ED Treatments / Results  Labs (all labs ordered are listed, but only abnormal results are displayed) Labs Reviewed  BASIC METABOLIC PANEL - Abnormal; Notable for the following components:      Result Value   Sodium 131 (*)    Potassium 3.3 (*)    Chloride 95 (*)    Glucose, Bld 122 (*)    Creatinine, Ser 1.22 (*)    Calcium 7.6 (*)    GFR calc non Af Amer 55 (*)    All other components within normal limits  CBC - Abnormal; Notable for the following  components:   Hemoglobin 9.3 (*)    HCT 32.6 (*)    MCV 72.9 (*)    MCH 20.8 (*)    MCHC 28.5 (*)    RDW 18.3 (*)    All other components within normal limits  TROPONIN I (HIGH SENSITIVITY) - Abnormal; Notable for the following components:   Troponin I (High Sensitivity) 26 (*)    All other components within normal limits  TROPONIN I (HIGH SENSITIVITY) - Abnormal; Notable for the following components:   Troponin I (High Sensitivity) 30 (*)    All other components within normal limits  SARS CORONAVIRUS 2 (HOSPITAL ORDER, PERFORMED IN [facility])  LACTIC ACID, PLASMA  LACTIC ACID, PLASMA  BRAIN NATRIURETIC PEPTIDE  I-STAT BETA HCG BLOOD, ED ([facility], [facility] ONLY)    EKG EKG Interpretation  Date/Time:  Friday March 16 2019 17:45:54 EDT Ventricular Rate:  88 PR Interval:  114 QRS Duration: 78 QT Interval:  380 QTC Calculation: 459 R Axis:   24 Text Interpretation:  Normal sinus rhythm Nonspecific ST abnormality Abnormal ECG Confirmed by Gerhard MunchLockwood, Trey Gulbranson [phone]([id number]) on 03/16/2019 9:56:13 PM   Radiology Dg Chest 2 View  Result Date: 03/16/2019 CLINICAL DATA:  Chest pain EXAM: CHEST - 2 VIEW COMPARISON:  08/24/2010 FINDINGS: No consolidation or effusion. Bilateral interstitial and vague ground-glass opacity. Normal heart size. No pneumothorax. IMPRESSION: Increased bilateral interstitial and ground-glass opacity as may be seen with interstitial inflammatory process versus possible atypical or viral pneumonia. Electronically Signed   By: Jasmine PangKim  Fujinaga M.D.   On: 03/16/2019 19:10    Procedures Procedures (including critical care time)  Medications Ordered in ED Medications  sodium chloride flush (NS) 0.9 % injection 3 mL (has no administration in time range)  sodium chloride 0.9 % bolus 1,000 mL (has no administration in time range)  ketorolac (TORADOL) 30 MG/ML injection 15 mg (has no administration in time range)  acetaminophen (TYLENOL) tablet 650 mg (650 mg Oral Given  03/16/19 1754)     Initial Impression / Assessment and Plan / ED Course  I have reviewed the triage vital signs and the nursing notes.  Pertinent labs & imaging results that were available during my care of the patient were reviewed by me and considered in my medical decision making (see chart for details).    Initial high-sensitivity troponin XX 6  Update: Repeat troponin 30, delta 4.    This adult female presents with 1 week of flulike illness, has tachycardia, tachypnea, hypoxia, requiring oxygen supplementation. Patient's presentation concerning for viral infection, with new oxygen requirement.  In addition, patient found to have slight elevation in high-sensitivity troponin, but without focal chest pain, and with reassuring EKG, there is low suspicion for true ACS, some suspicion for metabolic demand.  Patient received fluids, Toradol, will require admission for further monitoring, management.  Final Clinical Impressions(s) / ED Diagnoses   Final diagnoses:  Pneumonia due to COVID-19 virus     Gerhard MunchLockwood, Andrei Mccook, MD 03/16/19 [phone]

## 2019-03-16 NOTE — ED Triage Notes (Signed)
Reports SOB, fever, low o2 sats, n/v, and abdominal pain since Sunday. Temp 100.8 at this time.

## 2019-03-16 NOTE — ED Provider Notes (Signed)
11:00 PM  Assumed care from Dr. Vanita Panda.  Patient is a 43 y.o. F who presents to ED with hypoxia, 87% on RA and 4.5 days ILI, fever.  Covid pending.  CXR shows viral PNA.  12:21 AM Discussed patient's case with hospitalist, Dr. Si Raider.  I have recommended admission and patient (and family if present) agree with this plan. Admitting physician will place admission orders.   Patient is COVID positive.  She is doing well on 2 L nasal cannula.  I reviewed all nursing notes, vitals, pertinent previous records, EKGs, lab and urine results, imaging (as available).    Elizabeth [name] was evaluated in Emergency Department on 03/17/2019 for the symptoms described in the history of present illness. She was evaluated in the context of the global COVID-19 pandemic, which necessitated consideration that the patient might be at risk for infection with the SARS-CoV-2 virus that causes COVID-19. Institutional protocols and algorithms that pertain to the evaluation of patients at risk for COVID-19 are in a state of rapid change based on information released by regulatory bodies including the CDC and federal and state organizations. These policies and algorithms were followed during the patient's care in the ED.    Elizabeth [name], Elizabeth Bison, DO 03/17/19 0022

## 2019-03-17 ENCOUNTER — Encounter (HOSPITAL_COMMUNITY): Payer: Self-pay | Admitting: Family Medicine

## 2019-03-17 DIAGNOSIS — I10 Essential (primary) hypertension: Secondary | ICD-10-CM | POA: Diagnosis not present

## 2019-03-17 DIAGNOSIS — R0902 Hypoxemia: Secondary | ICD-10-CM | POA: Diagnosis present

## 2019-03-17 DIAGNOSIS — J1289 Other viral pneumonia: Secondary | ICD-10-CM

## 2019-03-17 DIAGNOSIS — J1282 Pneumonia due to coronavirus disease 2019: Secondary | ICD-10-CM | POA: Diagnosis present

## 2019-03-17 DIAGNOSIS — E871 Hypo-osmolality and hyponatremia: Secondary | ICD-10-CM | POA: Diagnosis present

## 2019-03-17 DIAGNOSIS — R7303 Prediabetes: Secondary | ICD-10-CM | POA: Diagnosis present

## 2019-03-17 DIAGNOSIS — G43909 Migraine, unspecified, not intractable, without status migrainosus: Secondary | ICD-10-CM | POA: Diagnosis present

## 2019-03-17 DIAGNOSIS — Z6841 Body Mass Index (BMI) 40.0 and over, adult: Secondary | ICD-10-CM | POA: Diagnosis not present

## 2019-03-17 DIAGNOSIS — E876 Hypokalemia: Secondary | ICD-10-CM | POA: Diagnosis present

## 2019-03-17 DIAGNOSIS — J9601 Acute respiratory failure with hypoxia: Secondary | ICD-10-CM | POA: Diagnosis not present

## 2019-03-17 DIAGNOSIS — I248 Other forms of acute ischemic heart disease: Secondary | ICD-10-CM | POA: Diagnosis present

## 2019-03-17 DIAGNOSIS — E669 Obesity, unspecified: Secondary | ICD-10-CM | POA: Diagnosis present

## 2019-03-17 DIAGNOSIS — U071 COVID-19: Secondary | ICD-10-CM | POA: Diagnosis present

## 2019-03-17 LAB — D-DIMER, QUANTITATIVE: D-Dimer, Quant: 1.44 ug/mL-FEU — ABNORMAL HIGH (ref 0.00–0.50)

## 2019-03-17 LAB — CBC WITH DIFFERENTIAL/PLATELET
Abs Immature Granulocytes: 0.02 10*3/uL (ref 0.00–0.07)
Basophils Absolute: 0 10*3/uL (ref 0.0–0.1)
Basophils Relative: 0 %
Eosinophils Absolute: 0.1 10*3/uL (ref 0.0–0.5)
Eosinophils Relative: 2 %
HCT: 29.9 % — ABNORMAL LOW (ref 36.0–46.0)
Hemoglobin: 8.5 g/dL — ABNORMAL LOW (ref 12.0–15.0)
Immature Granulocytes: 0 %
Lymphocytes Relative: 11 %
Lymphs Abs: 0.7 10*3/uL (ref 0.7–4.0)
MCH: 21 pg — ABNORMAL LOW (ref 26.0–34.0)
MCHC: 28.4 g/dL — ABNORMAL LOW (ref 30.0–36.0)
MCV: 73.8 fL — ABNORMAL LOW (ref 80.0–100.0)
Monocytes Absolute: 0.1 10*3/uL (ref 0.1–1.0)
Monocytes Relative: 2 %
Neutro Abs: 4.9 10*3/uL (ref 1.7–7.7)
Neutrophils Relative %: 85 %
Platelets: 315 10*3/uL (ref 150–400)
RBC: 4.05 MIL/uL (ref 3.87–5.11)
RDW: 18.4 % — ABNORMAL HIGH (ref 11.5–15.5)
WBC: 5.8 10*3/uL (ref 4.0–10.5)
nRBC: 0 % (ref 0.0–0.2)

## 2019-03-17 LAB — COMPREHENSIVE METABOLIC PANEL
ALT: 25 U/L (ref 0–44)
AST: 45 U/L — ABNORMAL HIGH (ref 15–41)
Albumin: 3.2 g/dL — ABNORMAL LOW (ref 3.5–5.0)
Alkaline Phosphatase: 51 U/L (ref 38–126)
Anion gap: 12 (ref 5–15)
BUN: 11 mg/dL (ref 6–20)
CO2: 26 mmol/L (ref 22–32)
Calcium: 7.8 mg/dL — ABNORMAL LOW (ref 8.9–10.3)
Chloride: 97 mmol/L — ABNORMAL LOW (ref 98–111)
Creatinine, Ser: 0.95 mg/dL (ref 0.44–1.00)
GFR calc Af Amer: >60 mL/min (ref >60)
GFR calc non Af Amer: >60 mL/min (ref >60)
Glucose, Bld: 130 mg/dL — ABNORMAL HIGH (ref 70–99)
Potassium: 3.8 mmol/L (ref 3.5–5.1)
Sodium: 135 mmol/L (ref 135–145)
Total Bilirubin: 0.2 mg/dL — ABNORMAL LOW (ref 0.3–1.2)
Total Protein: 7.1 g/dL (ref 6.5–8.1)

## 2019-03-17 LAB — BRAIN NATRIURETIC PEPTIDE: B Natriuretic Peptide: 41.1 pg/mL (ref 0.0–100.0)

## 2019-03-17 LAB — HEMOGLOBIN A1C
Hgb A1c MFr Bld: 6.3 % — ABNORMAL HIGH (ref 4.8–5.6)
Mean Plasma Glucose: 134.11 mg/dL

## 2019-03-17 LAB — C-REACTIVE PROTEIN: CRP: 10.5 mg/dL — ABNORMAL HIGH (ref <1.0)

## 2019-03-17 LAB — LACTATE DEHYDROGENASE: LDH: 342 U/L — ABNORMAL HIGH (ref 98–192)

## 2019-03-17 LAB — FERRITIN: Ferritin: 80 ng/mL (ref 11–307)

## 2019-03-17 MED ORDER — METHYLPREDNISOLONE SODIUM SUCC 125 MG IJ SOLR
60.0000 mg | Freq: Every day | INTRAMUSCULAR | Status: DC
  Administered 2019-03-18 – 2019-03-21 (×4): 60 mg via INTRAVENOUS
  Filled 2019-03-17 (×4): qty 2

## 2019-03-17 MED ORDER — VITAMIN C 500 MG PO TABS
500.0000 mg | ORAL_TABLET | Freq: Every day | ORAL | Status: DC
  Administered 2019-03-17 – 2019-03-21 (×5): 500 mg via ORAL
  Filled 2019-03-17 (×5): qty 1

## 2019-03-17 MED ORDER — SODIUM CHLORIDE 0.9 % IV SOLN
200.0000 mg | Freq: Once | INTRAVENOUS | Status: AC
  Administered 2019-03-17: 200 mg via INTRAVENOUS
  Filled 2019-03-17: qty 40

## 2019-03-17 MED ORDER — VITAMIN D 25 MCG (1000 UNIT) PO TABS
1000.0000 [IU] | ORAL_TABLET | Freq: Every day | ORAL | Status: DC
  Administered 2019-03-17 – 2019-03-21 (×5): 1000 [IU] via ORAL
  Filled 2019-03-17 (×5): qty 1

## 2019-03-17 MED ORDER — IPRATROPIUM BROMIDE HFA 17 MCG/ACT IN AERS
2.0000 | INHALATION_SPRAY | Freq: Three times a day (TID) | RESPIRATORY_TRACT | Status: DC
  Administered 2019-03-17 (×3): 2 via RESPIRATORY_TRACT
  Filled 2019-03-17: qty 12.9

## 2019-03-17 MED ORDER — ACETAMINOPHEN 325 MG PO TABS: 650.0000 mg | ORAL_TABLET | Freq: Four times a day (QID) | ORAL | Status: DC | PRN

## 2019-03-17 MED ORDER — LEVALBUTEROL TARTRATE 45 MCG/ACT IN AERO
2.0000 | INHALATION_SPRAY | Freq: Three times a day (TID) | RESPIRATORY_TRACT | Status: DC
  Administered 2019-03-17 – 2019-03-18 (×4): 2 via RESPIRATORY_TRACT
  Filled 2019-03-17: qty 15

## 2019-03-17 MED ORDER — POTASSIUM CHLORIDE CRYS ER 20 MEQ PO TBCR
40.0000 meq | EXTENDED_RELEASE_TABLET | Freq: Once | ORAL | Status: AC
  Administered 2019-03-17: 40 meq via ORAL
  Filled 2019-03-17: qty 2

## 2019-03-17 MED ORDER — SODIUM CHLORIDE 0.9 % IV SOLN
100.0000 mg | INTRAVENOUS | Status: AC
  Administered 2019-03-18 – 2019-03-21 (×4): 100 mg via INTRAVENOUS
  Filled 2019-03-17 (×4): qty 20

## 2019-03-17 MED ORDER — ENOXAPARIN SODIUM 40 MG/0.4ML ~~LOC~~ SOLN: 40.0000 mg | SUBCUTANEOUS | Status: DC

## 2019-03-17 MED ORDER — METHYLPREDNISOLONE SODIUM SUCC 125 MG IJ SOLR
60.0000 mg | Freq: Two times a day (BID) | INTRAMUSCULAR | Status: DC
  Administered 2019-03-17: 60 mg via INTRAVENOUS
  Filled 2019-03-17: qty 2

## 2019-03-17 MED ORDER — KCL IN DEXTROSE-NACL 10-5-0.45 MEQ/L-%-% IV SOLN
INTRAVENOUS | Status: DC
  Administered 2019-03-17: 11:00:00 via INTRAVENOUS
  Filled 2019-03-17: qty 1000

## 2019-03-17 MED ORDER — ZINC SULFATE 220 (50 ZN) MG PO CAPS
220.0000 mg | ORAL_CAPSULE | Freq: Every day | ORAL | Status: DC
  Administered 2019-03-17 – 2019-03-21 (×5): 220 mg via ORAL
  Filled 2019-03-17 (×5): qty 1

## 2019-03-17 MED ORDER — ENOXAPARIN SODIUM 60 MG/0.6ML ~~LOC~~ SOLN
60.0000 mg | SUBCUTANEOUS | Status: DC
  Administered 2019-03-17 – 2019-03-20 (×4): 60 mg via SUBCUTANEOUS
  Filled 2019-03-17 (×5): qty 0.6

## 2019-03-17 NOTE — ED Notes (Addendum)
Called [facility] to make sure that report had already been given ; report already given

## 2019-03-17 NOTE — ED Notes (Signed)
Called [facility] to check on status of pt transport.  Proceeded to called [facility] to check on availability for pt transport. Advised that crews would be in route.   

## 2019-03-17 NOTE — Care Management (Signed)
Case managers will continue to monitor for appropriate discharge plan as patient medically improves. May God bless her to do so. Patient if from home, several other family member are COVID+ after all being together at a funeral earlier this month.    Ricki Miller, RN BSN Case Manager [phone].

## 2019-03-17 NOTE — ED Notes (Signed)
Checked status w/ [facility], will be after shift change before picked up

## 2019-03-17 NOTE — Progress Notes (Signed)
PROGRESS NOTE  Brief Narrative: Elizabeth [name] is a 43 y.o. female with a history of obesity and HTN who presented to the ED with worsening shortness of breath for the previous week after exposure to sick contacts at a funeral around 03/01/2019. She was found to have infiltrates on CXR, requiring supplemental oxygen, and tested positive for covid-19. She was transferred to [facility] this morning, given IV steroids. She states she feels about the same as presentation, but no worse. No hx TB, hepatitis. Currently febrile to 102.95F.   Objective: BP 128/71   Pulse 73   Temp (!) 102.4 F (39.1 C) (Oral)   Resp 14   Ht 5\' 6"  (1.676 m)   Wt 121.6 kg   LMP 02/21/2019 (Approximate)   SpO2 94%   BMI 43.26 kg/m   Gen: No distress Pulm: Clear and nonlabored on supplemental oxygen  CV: RRR, no murmur, no JVD, no edema GI: Soft, NT, ND, +BS  Neuro: Alert and oriented. No focal deficits. Skin: No rashes, lesions or ulcers  Assessment & Plan: Acute hypoxia respiratory failure due to covid-19 pneumonia: Now entering 2nd week of symptoms. Obesity and HTN RF's for worsening.  - Given steroids, will continue.  - Start remdesivir (6/27 - 7/1)  - No indication for further investigational/off-label therapies at this time.  - Check inflammatory labs, trend.  - Maintain euvolemia/net negative.  - Avoid NSAIDs - Recommend proning and aggressive use of incentive spirometry. - hs-Troponin modestly abnormal consistent with demand due to infection. No concerning ST elevation or depression on ECG and no chest pain.   HTN: Currently normotensive.    Patrecia Pour, MD Pager [phone] 03/17/2019, 11:13 AM

## 2019-03-17 NOTE — ED Notes (Signed)
Checked status w/ [facility], will be after shift change before picked up 

## 2019-03-17 NOTE — ED Notes (Signed)
Ordered bfast 

## 2019-03-17 NOTE — H&P (Signed)
History and Physical    [name] CWC:[id number] DOB: April 14, 1976 DOA: 03/16/2019  PCP: [facility]  Patient coming from: home   Chief Complaint: dyspnea  HPI: Elizabeth [name] is a 43 y.o. female with medical history significant for hypertension and obesity who presents with above.  Patient traveled to [place] a couple of weeks ago to attend a family funeral. She since learned that at least one of her family members there with whom she had close contact was covid positive.   Patient reports 4-5 days of symptoms. Initially fatigue and loss of smell. Then last couple of days developed shortness of breath and dry cough. SOB worse with ambulation. Occasional chest pain w/ cough. No lower extremity swelling. More fatigued today. No vomiting or diarrhea.   ED Course: supplemental O2 (oxygen mid-80s on arrival), labs  Review of Systems: As per HPI otherwise 10 point review of systems negative.    Past Medical History:  Diagnosis Date  . Hypertension   . Migraines     History reviewed. No pertinent surgical history.   reports that she has never smoked. She has never used smokeless tobacco. She reports current alcohol use. No history on file for drug.  No Known Allergies  No family history on file.  Prior to Admission medications   Medication Sig Start Date End Date Taking? Authorizing Provider  amLODipine (NORVASC) 10 MG tablet Take 1 tablet (10 mg total) by mouth daily. 08/15/16  Yes Little, Wenda Overland, MD  azithromycin (ZITHROMAX) 250 MG tablet Take 250-500 mg by mouth See admin instructions. Take 2 tablets on day 1 then take 1 tablet daily for 4 days 03/14/19  Yes [provider]    Physical Exam: Vitals:   03/16/19 2300 03/16/19 2315 03/16/19 2330 03/16/19 2345  BP: 91/70 103/67 111/66 (!) 102/57  Pulse: 68 63 64 60  Resp: 17 18 19 16   Temp:      TempSrc:      SpO2: 95% 95% 96% 98%  Weight:      Height:         Constitutional: mild distress Head: Atraumatic Respiratory: mild tachypnea, rhonchi scattered throughout Cardiovascular: Regular rate and rhythm. No murmurs/rubs/gallops. Abdomen: Non-tender, non-distended.  Skin: No rashes, lesions, or ulcers.  Extremities: No peripheral edema.  Neurologic: Alert, moving all 4 extremities. Psychiatric: Normal insight and judgement.   Labs on Admission: I have personally reviewed following labs and imaging studies  CBC: Recent Labs  Lab 03/16/19 1800  WBC 5.8  HGB 9.3*  HCT 32.6*  MCV 72.9*  PLT 761   Basic Metabolic Panel: Recent Labs  Lab 03/16/19 1800  NA 131*  K 3.3*  CL 95*  CO2 24  GLUCOSE 122*  BUN 6  CREATININE 1.22*  CALCIUM 7.6*   GFR: Estimated Creatinine Clearance: 80.7 mL/min (A) (by C-G formula based on SCr of 1.22 mg/dL (H)). Liver Function Tests: No results for input(s): AST, ALT, ALKPHOS, BILITOT, PROT, ALBUMIN in the last 168 hours. No results for input(s): LIPASE, AMYLASE in the last 168 hours. No results for input(s): AMMONIA in the last 168 hours. Coagulation Profile: No results for input(s): INR, PROTIME in the last 168 hours. Cardiac Enzymes: No results for input(s): CKTOTAL, CKMB, CKMBINDEX, TROPONINI in the last 168 hours. BNP (last 3 results) No results for input(s): PROBNP in the last 8760 hours. HbA1C: No results for input(s): HGBA1C in the last 72 hours. CBG: No results for input(s): GLUCAP in the last 168  hours. Lipid Profile: No results for input(s): CHOL, HDL, LDLCALC, TRIG, CHOLHDL, LDLDIRECT in the last 72 hours. Thyroid Function Tests: No results for input(s): TSH, T4TOTAL, FREET4, T3FREE, THYROIDAB in the last 72 hours. Anemia Panel: No results for input(s): VITAMINB12, FOLATE, FERRITIN, TIBC, IRON, RETICCTPCT in the last 72 hours. Urine analysis: No results found for: COLORURINE, APPEARANCEUR, LABSPEC, PHURINE, GLUCOSEU, HGBUR, BILIRUBINUR, KETONESUR, PROTEINUR, UROBILINOGEN, NITRITE,  LEUKOCYTESUR  Radiological Exams on Admission: Dg Chest 2 View  Result Date: 03/16/2019 CLINICAL DATA:  Chest pain EXAM: CHEST - 2 VIEW COMPARISON:  08/24/2010 FINDINGS: No consolidation or effusion. Bilateral interstitial and vague ground-glass opacity. Normal heart size. No pneumothorax. IMPRESSION: Increased bilateral interstitial and ground-glass opacity as may be seen with interstitial inflammatory process versus possible atypical or viral pneumonia. Electronically Signed   By: Jasmine PangKim  Fujinaga M.D.   On: 03/16/2019 19:10    EKG: Independently reviewed. nsr  Assessment/Plan Active Problems:   Hypertension   Pneumonia due to COVID-19 virus   Obesity   # covid-19 pneumonia - known covid contact, covid positive, cxr with b/l interstitial and ground glass opacities, febrile. Hypoxic on arrival but now satting normally on 2 L [place] O2 and breathing relatively comfortably. Hemodynamically stable. Labs sig for mild troponin elevation, and mild hypokalemia and hyponatremia. Low suspicion for concurrent other cardiopulmonary process. - check baseline crp, ldh, d dimer, fibrinogen, LFTs, hiv, hep b surface antigen - daily labs ordered: ekg, lfts, cbc, bmp, troponin, crp, ferritin, fibrinogen, d dimer - [place] O2 - d5 1/2 NS @ 125 - droplet/contact precautions - [facility] admission - tylenol prn  # Hypertension - bp wnl - hold home amlodipine in setting of acute infectioius illness  DVT prophylaxis: lovenox Code Status: full  Family Communication: husband daniel [phone]  Disposition Plan: tbd  Consults called: none  Admission status: med/surg    Silvano BilisNoah B Levaughn Puccinelli MD [facility] Pager [phone]  If 7PM-7AM, please contact night-coverage [email or web address] Password [email or web address]  03/17/2019, 12:42 AM

## 2019-03-17 NOTE — Progress Notes (Signed)
Elizabeth [name] is a 43 y.o. female with medical history significant for hypertension and obesity who presents with above.  Patient traveled to [place] a couple of weeks ago to attend a family funeral. She since learned that at least one of her family members there with whom she had close contact was covid positive.   Patient reports 4-5 days of symptoms. Initially fatigue and loss of smell. Then last couple of days developed shortness of breath and dry cough. SOB worse with ambulation. Occasional chest pain w/ cough. No lower extremity swelling. More fatigued today. No vomiting or diarrhea.   03/17/19: Patient was seen this morning prior to transfer to [facility].  Denies chest pain.  Still with dyspnea.  O2 saturation 93% on 2 L [place] on the monitor.  Other vital signs on the monitor stable.  Patient states she had her fallopian tubes tied, HCG pending.  Started Solu-Medrol IV 60 mg twice daily, inhalers 3 times daily, vitamin C, vitamin D3, and zinc, subcu Lovenox daily.  Possible initiation of Remdesivir at [facility].  Please refer to H&P dictated by Dr. Si Raider on 03/17/2019 for further details of the assessment and plan.

## 2019-03-18 DIAGNOSIS — Z6841 Body Mass Index (BMI) 40.0 and over, adult: Secondary | ICD-10-CM

## 2019-03-18 DIAGNOSIS — J9601 Acute respiratory failure with hypoxia: Secondary | ICD-10-CM

## 2019-03-18 DIAGNOSIS — I10 Essential (primary) hypertension: Secondary | ICD-10-CM

## 2019-03-18 LAB — COMPREHENSIVE METABOLIC PANEL
ALT: 29 U/L (ref 0–44)
AST: 40 U/L (ref 15–41)
Albumin: 3.2 g/dL — ABNORMAL LOW (ref 3.5–5.0)
Alkaline Phosphatase: 47 U/L (ref 38–126)
Anion gap: 11 (ref 5–15)
BUN: 14 mg/dL (ref 6–20)
CO2: 29 mmol/L (ref 22–32)
Calcium: 8.3 mg/dL — ABNORMAL LOW (ref 8.9–10.3)
Chloride: 99 mmol/L (ref 98–111)
Creatinine, Ser: 0.77 mg/dL (ref 0.44–1.00)
GFR calc Af Amer: >60 mL/min (ref >60)
GFR calc non Af Amer: >60 mL/min (ref >60)
Glucose, Bld: 116 mg/dL — ABNORMAL HIGH (ref 70–99)
Potassium: 3.8 mmol/L (ref 3.5–5.1)
Sodium: 139 mmol/L (ref 135–145)
Total Bilirubin: 0.5 mg/dL (ref 0.3–1.2)
Total Protein: 7.2 g/dL (ref 6.5–8.1)

## 2019-03-18 LAB — CBC WITH DIFFERENTIAL/PLATELET
Abs Immature Granulocytes: 0.01 10*3/uL (ref 0.00–0.07)
Basophils Absolute: 0 10*3/uL (ref 0.0–0.1)
Basophils Relative: 0 %
Eosinophils Absolute: 0 10*3/uL (ref 0.0–0.5)
Eosinophils Relative: 0 %
HCT: 29.6 % — ABNORMAL LOW (ref 36.0–46.0)
Hemoglobin: 8.4 g/dL — ABNORMAL LOW (ref 12.0–15.0)
Immature Granulocytes: 0 %
Lymphocytes Relative: 29 %
Lymphs Abs: 1.3 10*3/uL (ref 0.7–4.0)
MCH: 21 pg — ABNORMAL LOW (ref 26.0–34.0)
MCHC: 28.4 g/dL — ABNORMAL LOW (ref 30.0–36.0)
MCV: 74 fL — ABNORMAL LOW (ref 80.0–100.0)
Monocytes Absolute: 0.3 10*3/uL (ref 0.1–1.0)
Monocytes Relative: 7 %
Neutro Abs: 2.9 10*3/uL (ref 1.7–7.7)
Neutrophils Relative %: 64 %
Platelets: 309 10*3/uL (ref 150–400)
RBC: 4 MIL/uL (ref 3.87–5.11)
RDW: 18.5 % — ABNORMAL HIGH (ref 11.5–15.5)
WBC: 4.5 10*3/uL (ref 4.0–10.5)
nRBC: 0 % (ref 0.0–0.2)

## 2019-03-18 LAB — HEPATITIS B SURFACE ANTIGEN: Hepatitis B Surface Ag: NEGATIVE

## 2019-03-18 LAB — FERRITIN: Ferritin: 97 ng/mL (ref 11–307)

## 2019-03-18 LAB — HIV ANTIBODY (ROUTINE TESTING W REFLEX): HIV Screen 4th Generation wRfx: NONREACTIVE

## 2019-03-18 LAB — C-REACTIVE PROTEIN: CRP: 12.4 mg/dL — ABNORMAL HIGH (ref <1.0)

## 2019-03-18 LAB — D-DIMER, QUANTITATIVE: D-Dimer, Quant: 0.32 ug/mL-FEU (ref 0.00–0.50)

## 2019-03-18 MED ORDER — IPRATROPIUM BROMIDE HFA 17 MCG/ACT IN AERS
2.0000 | INHALATION_SPRAY | Freq: Two times a day (BID) | RESPIRATORY_TRACT | Status: DC
  Administered 2019-03-18 – 2019-03-21 (×7): 2 via RESPIRATORY_TRACT
  Filled 2019-03-18: qty 12.9

## 2019-03-18 MED ORDER — LEVALBUTEROL TARTRATE 45 MCG/ACT IN AERO
2.0000 | INHALATION_SPRAY | Freq: Two times a day (BID) | RESPIRATORY_TRACT | Status: DC
  Administered 2019-03-18 – 2019-03-21 (×6): 2 via RESPIRATORY_TRACT
  Filled 2019-03-18: qty 15

## 2019-03-18 NOTE — Progress Notes (Signed)
PROGRESS NOTE  Elizabeth [name]  KZS:[id number] DOB: 1976/09/01 DOA: 03/16/2019 [facility]CP: [facility]   Brief Narrative: ELIAH OZAWA is a 43 y.o. female with a history of obesity and HTN who presented to the ED with worsening shortness of breath for the previous week after exposure to sick contacts at a funeral around 03/01/2019. She was found to have infiltrates on CXR, requiring supplemental oxygen, and tested positive for covid-19. She was transferred to [facility] 6/27, given IV steroids. Remdesivir subsequently started (6/27 - 7/1).  Assessment & Plan: Principal Problem:   Pneumonia due to COVID-19 virus Active Problems:   Hypertension   Obesity   Acute respiratory failure with hypoxia (HCC)  Acute hypoxia respiratory failure due to covid-19 pneumonia: Now entering 2nd week of symptoms. Obesity and HTN RF's for worsening.  - Continue steroids - Continue remdesivir (6/27 - 7/1) - No indication for further investigational/off-label therapies at this time.  - Check inflammatory labs, trend. CRP significantly elevated. - Maintain euvolemia/net negative.  - Avoid NSAIDs - Recommend proning and aggressive use of incentive spirometry. - hs-Troponin modestly abnormal consistent with demand due to infection. No concerning ST elevation or depression on ECG and no chest pain. No further evaluation planned.  Morbid obesity: BMI 43.  - Dietitian consult   Prediabetes: HbA1c 6.3%. No significant hyperglycemia on labs so far. - Dietitian consult  HTN: Currently normotensive.   DVT prophylaxis: Lovenox Code Status: Full Family Communication: None at bedside, pt to relay POC to family Disposition Plan: Home once improved, likely 7/1  Consultants:   None  Procedures:   None  Antimicrobials:  Remdesivir   Subjective: Stable from yesterday, still some cough and shortness of breath constantly which is worse with exertion, improved with supplemental oxygen.  +Fever yesterday.   Objective: Vitals:   03/17/19 0853 03/17/19 2129 03/18/19 0653 03/18/19 0654  BP:  124/73  123/67  Pulse: 73 65 67 69  Resp:  (!) 22  20  Temp: (!) 102.4 F (39.1 C) 99.3 F (37.4 C)  99.6 F (37.6 C)  TempSrc: Oral Oral  Oral  SpO2: 94% 94% 91% 94%  Weight:      Height:        Intake/Output Summary (Last 24 hours) at 03/18/2019 1212 Last data filed at 03/17/2019 1500 Gross per 24 hour  Intake 650 ml  Output -  Net 650 ml   Filed Weights   03/16/19 1746  Weight: 121.6 kg    Gen: 44 y.o. female in no distress  Pulm: Non-labored breathing O2. Clear to auscultation bilaterally.  CV: Regular rate and rhythm. No murmur, rub, or gallop. No JVD, no pedal edema. GI: Abdomen soft, non-tender, non-distended, with normoactive bowel sounds. No organomegaly or masses felt. Ext: Warm, no deformities Skin: No rashes, lesions ulcers Neuro: Alert and oriented. No focal neurological deficits. Psych: Judgement and insight appear normal. Mood & affect appropriate.   Data Reviewed: I have personally reviewed following labs and imaging studies  CBC: Recent Labs  Lab 03/16/19 1800 03/17/19 0945 03/18/19 0237  WBC 5.8 5.8 4.5  NEUTROABS  --  4.9 2.9  HGB 9.3* 8.5* 8.4*  HCT 32.6* 29.9* 29.6*  MCV 72.9* 73.8* 74.0*  PLT 338 315 732   Basic Metabolic Panel: Recent Labs  Lab 03/16/19 1800 03/17/19 0945 03/18/19 0237  NA 131* 135 139  K 3.3* 3.8 3.8  CL 95* 97* 99  CO2 24 26 29   GLUCOSE 122* 130* 116*  BUN  6 11 14   CREATININE 1.22* 0.95 0.77  CALCIUM 7.6* 7.8* 8.3*   GFR: Estimated Creatinine Clearance: 123 mL/min (by C-G formula based on SCr of 0.77 mg/dL). Liver Function Tests: Recent Labs  Lab 03/17/19 0945 03/18/19 0237  AST 45* 40  ALT 25 29  ALKPHOS 51 47  BILITOT 0.2* 0.5  PROT 7.1 7.2  ALBUMIN 3.2* 3.2*   No results for input(s): LIPASE, AMYLASE in the last 168 hours. No results for input(s): AMMONIA in the last 168 hours. Coagulation  Profile: No results for input(s): INR, PROTIME in the last 168 hours. Cardiac Enzymes: No results for input(s): CKTOTAL, CKMB, CKMBINDEX, TROPONINI in the last 168 hours. BNP (last 3 results) No results for input(s): PROBNP in the last 8760 hours. HbA1C: Recent Labs    03/17/19 0945  HGBA1C 6.3*   CBG: No results for input(s): GLUCAP in the last 168 hours. Lipid Profile: No results for input(s): CHOL, HDL, LDLCALC, TRIG, CHOLHDL, LDLDIRECT in the last 72 hours. Thyroid Function Tests: No results for input(s): TSH, T4TOTAL, FREET4, T3FREE, THYROIDAB in the last 72 hours. Anemia Panel: Recent Labs    03/17/19 0945 03/18/19 0237  FERRITIN 80 97   Urine analysis: No results found for: COLORURINE, APPEARANCEUR, LABSPEC, PHURINE, GLUCOSEU, HGBUR, BILIRUBINUR, KETONESUR, PROTEINUR, UROBILINOGEN, NITRITE, LEUKOCYTESUR Recent Results (from the past 240 hour(s))  SARS Coronavirus 2 (CEPHEID- [facility]), Hosp Order     Status: Abnormal   Collection Time: 03/16/19 10:16 PM   Specimen: Nasopharyngeal Swab  Result Value Ref Range Status   SARS Coronavirus 2 POSITIVE (A) NEGATIVE Final    Comment: RESULT CALLED TO, READ BACK BY AND VERIFIED WITH: K WARD RN 03/16/19 2328 JDW (NOTE) If result is NEGATIVE SARS-CoV-2 target nucleic acids are NOT DETECTED. The SARS-CoV-2 RNA is generally detectable in upper and lower  respiratory specimens during the acute phase of infection. The lowest  concentration of SARS-CoV-2 viral copies this assay can detect is 250  copies / mL. A negative result does not preclude SARS-CoV-2 infection  and should not be used as the sole basis for treatment or other  patient management decisions.  A negative result may occur with  improper specimen collection / handling, submission of specimen other  than nasopharyngeal swab, presence of viral mutation(s) within the  areas targeted by this assay, and inadequate number of viral copies   (<250 copies / mL). A negative result must be combined with clinical  observations, patient history, and epidemiological information. If result is POSITIVE SARS-CoV-2 target nucleic acids are DETECTED. The SARS-C oV-2 RNA is generally detectable in upper and lower  respiratory specimens during the acute phase of infection.  Positive  results are indicative of active infection with SARS-CoV-2.  Clinical  correlation with patient history and other diagnostic information is  necessary to determine patient infection status.  Positive results do  not rule out bacterial infection or co-infection with other viruses. If result is PRESUMPTIVE POSTIVE SARS-CoV-2 nucleic acids MAY BE PRESENT.   A presumptive positive result was obtained on the submitted specimen  and confirmed on repeat testing.  While 2019 novel coronavirus  (SARS-CoV-2) nucleic acids may be present in the submitted sample  additional confirmatory testing may be necessary for epidemiological  and / or clinical management purposes  to differentiate between  SARS-CoV-2 and other Sarbecovirus currently known to infect humans.  If clinically indicated additional testing with an alternate test  methodology [phone]([id number] is advised.  The SARS-CoV-2 RNA  is generally  detectable in upper and lower respiratory specimens during the acute  phase of infection. The expected result is Negative. Fact Sheet for Patients:  BoilerBrush.com.cyhttps://www.fda.gov/media/136312/download Fact Sheet for Healthcare Providers: https://pope.com/https://www.fda.gov/media/136313/download This test is not yet approved or cleared by the [place] FDA and has been authorized for detection and/or diagnosis of SARS-CoV-2 by FDA under an Emergency Use Authorization (EUA).  This EUA will remain in effect (meaning this test can be used) for the duration of the COVID-19 declaration under Section 564(b)(1) of the Act, 21 U.S.C. section 360bbb-3(b)(1), unless the authorization is terminated or  revoked sooner. Performed at [facility], [address] [place], [place] [address]       Radiology Studies: Dg Chest 2 View  Result Date: 03/16/2019 CLINICAL DATA:  Chest pain EXAM: CHEST - 2 VIEW COMPARISON:  08/24/2010 FINDINGS: No consolidation or effusion. Bilateral interstitial and vague ground-glass opacity. Normal heart size. No pneumothorax. IMPRESSION: Increased bilateral interstitial and ground-glass opacity as may be seen with interstitial inflammatory process versus possible atypical or viral pneumonia. Electronically Signed   By: Jasmine PangKim  Fujinaga M.D.   On: 03/16/2019 19:10    Scheduled Meds: . cholecalciferol  1,000 Units Oral Daily  . enoxaparin (LOVENOX) injection  60 mg Subcutaneous Q24H  . ipratropium  2 puff Inhalation BID  . levalbuterol  2 puff Inhalation BID  . methylPREDNISolone (SOLU-MEDROL) injection  60 mg Intravenous Daily  . vitamin C  500 mg Oral Daily  . zinc sulfate  220 mg Oral Daily   Continuous Infusions: . remdesivir 100 mg in NS 250 mL       LOS: 1 day   Time spent: 35 minutes.  Tyrone Nineyan B Coty Larsh, MD [facility] [email or web address] Password [email or web address] 03/18/2019, 12:12 PM

## 2019-03-19 LAB — COMPREHENSIVE METABOLIC PANEL
ALT: 30 U/L (ref 0–44)
AST: 34 U/L (ref 15–41)
Albumin: 2.9 g/dL — ABNORMAL LOW (ref 3.5–5.0)
Alkaline Phosphatase: 46 U/L (ref 38–126)
Anion gap: 10 (ref 5–15)
BUN: 18 mg/dL (ref 6–20)
CO2: 28 mmol/L (ref 22–32)
Calcium: 7.9 mg/dL — ABNORMAL LOW (ref 8.9–10.3)
Chloride: 101 mmol/L (ref 98–111)
Creatinine, Ser: 0.6 mg/dL (ref 0.44–1.00)
GFR calc Af Amer: >60 mL/min (ref >60)
GFR calc non Af Amer: >60 mL/min (ref >60)
Glucose, Bld: 104 mg/dL — ABNORMAL HIGH (ref 70–99)
Potassium: 4 mmol/L (ref 3.5–5.1)
Sodium: 139 mmol/L (ref 135–145)
Total Bilirubin: 0.4 mg/dL (ref 0.3–1.2)
Total Protein: 6.9 g/dL (ref 6.5–8.1)

## 2019-03-19 LAB — CBC WITH DIFFERENTIAL/PLATELET
Abs Immature Granulocytes: 0.04 10*3/uL (ref 0.00–0.07)
Basophils Absolute: 0 10*3/uL (ref 0.0–0.1)
Basophils Relative: 0 %
Eosinophils Absolute: 0 10*3/uL (ref 0.0–0.5)
Eosinophils Relative: 0 %
HCT: 29.1 % — ABNORMAL LOW (ref 36.0–46.0)
Hemoglobin: 8.2 g/dL — ABNORMAL LOW (ref 12.0–15.0)
Immature Granulocytes: 1 %
Lymphocytes Relative: 20 %
Lymphs Abs: 1.3 10*3/uL (ref 0.7–4.0)
MCH: 20.9 pg — ABNORMAL LOW (ref 26.0–34.0)
MCHC: 28.2 g/dL — ABNORMAL LOW (ref 30.0–36.0)
MCV: 74.2 fL — ABNORMAL LOW (ref 80.0–100.0)
Monocytes Absolute: 0.3 10*3/uL (ref 0.1–1.0)
Monocytes Relative: 5 %
Neutro Abs: 4.8 10*3/uL (ref 1.7–7.7)
Neutrophils Relative %: 74 %
Platelets: 336 10*3/uL (ref 150–400)
RBC: 3.92 MIL/uL (ref 3.87–5.11)
RDW: 18.7 % — ABNORMAL HIGH (ref 11.5–15.5)
WBC: 6.4 10*3/uL (ref 4.0–10.5)
nRBC: 0 % (ref 0.0–0.2)

## 2019-03-19 LAB — FERRITIN: Ferritin: 80 ng/mL (ref 11–307)

## 2019-03-19 LAB — C-REACTIVE PROTEIN: CRP: 9 mg/dL — ABNORMAL HIGH (ref <1.0)

## 2019-03-19 LAB — D-DIMER, QUANTITATIVE: D-Dimer, Quant: 0.32 ug/mL-FEU (ref 0.00–0.50)

## 2019-03-19 NOTE — Progress Notes (Signed)
PROGRESS NOTE  Elizabeth [name]  ATF:[id number] DOB: 1975/11/27 DOA: 03/16/2019 PCP: [facility]   Brief Narrative: Elizabeth [name] is a 43 y.o. female with a history of obesity and HTN who presented to the ED with worsening shortness of breath for the previous week after exposure to sick contacts at a funeral around 03/01/2019. She was found to have infiltrates on CXR, requiring supplemental oxygen, and tested positive for covid-19. She was transferred to [facility] 6/27, given IV steroids. Remdesivir subsequently started (6/27 - 7/1). Remains hypoxic.  Assessment & Plan: Principal Problem:   Pneumonia due to COVID-19 virus Active Problems:   Hypertension   Obesity   Acute respiratory failure with hypoxia (HCC)  Acute hypoxia respiratory failure due to covid-19 pneumonia: Now entering 2nd week of symptoms. Obesity and HTN RF's for worsening.  - Continue steroids - Continue remdesivir (6/27 - 7/1) - No indication for further investigational/off-label therapies at this time.  - Check inflammatory labs, trend. CRP significantly elevated. - Maintain euvolemia/net negative.  - Avoid NSAIDs - Recommend proning and aggressive use of incentive spirometry. D/w patient today. - hs-Troponin modestly abnormal consistent with demand due to infection. No concerning ST elevation or depression on ECG and no chest pain. No further evaluation planned.  Morbid obesity: BMI 43.  - Dietitian consult   Prediabetes: HbA1c 6.3%. No significant hyperglycemia on labs so far. - Dietitian consult  HTN: Currently normotensive.   DVT prophylaxis: Lovenox Code Status: Full Family Communication: None at bedside, pt to relay POC to family Disposition Plan: Home once improved, 7/1 at earliest though remains hypoxic at rest.  Consultants:   None  Procedures:   None  Antimicrobials:  Remdesivir 6/27 - 7/1  Subjective: Had trouble sleeping due to beeping of monitors, not  significantly dyspneic even when hypoxic at rest and exertion. No chest pain or other complaints.    Objective: Vitals:   03/18/19 0653 03/18/19 0654 03/18/19 1900 03/19/19 0400  BP:  123/67 123/67 104/70  Pulse: 67 69 69 60  Resp:  20 20 18   Temp:  99.6 F (37.6 C) 98.9 F (37.2 C) 98.3 F (36.8 C)  TempSrc:  Oral Oral Oral  SpO2: 91% 94% 92% 93%  Weight:      Height:        Intake/Output Summary (Last 24 hours) at 03/19/2019 0931 Last data filed at 03/18/2019 2000 Gross per 24 hour  Intake 360 ml  Output -  Net 360 ml   Filed Weights   03/16/19 1746  Weight: 121.6 kg   Gen: 43 y.o. female in no distress Pulm: Nonlabored breathing supplemental oxygen at rest SpO2 88-92%. Clear. CV: Regular rate and rhythm. No murmur, rub, or gallop. No JVD, no dependent edema. GI: Abdomen soft, non-tender, non-distended, with normoactive bowel sounds.  Ext: Warm, no deformities Skin: No rashes, lesions or ulcers on visualized skin. Neuro: Alert and oriented. No focal neurological deficits. Psych: Judgement and insight appear fair. Mood euthymic & affect congruent. Behavior is appropriate.    Data Reviewed: I have personally reviewed following labs and imaging studies  CBC: Recent Labs  Lab 03/16/19 1800 03/17/19 0945 03/18/19 0237 03/19/19 0530  WBC 5.8 5.8 4.5 6.4  NEUTROABS  --  4.9 2.9 4.8  HGB 9.3* 8.5* 8.4* 8.2*  HCT 32.6* 29.9* 29.6* 29.1*  MCV 72.9* 73.8* 74.0* 74.2*  PLT 338 315 309 270   Basic Metabolic Panel: Recent Labs  Lab 03/16/19 1800 03/17/19 0945 03/18/19 0237 03/19/19  0530  NA 131* 135 139 139  K 3.3* 3.8 3.8 4.0  CL 95* 97* 99 101  CO2 24 26 29 28   GLUCOSE 122* 130* 116* 104*  BUN 6 11 14 18   CREATININE 1.22* 0.95 0.77 0.60  CALCIUM 7.6* 7.8* 8.3* 7.9*   GFR: Estimated Creatinine Clearance: 123 mL/min (by C-G formula based on SCr of 0.6 mg/dL). Liver Function Tests: Recent Labs  Lab 03/17/19 0945 03/18/19 0237 03/19/19 0530  AST 45* 40 34   ALT 25 29 30   ALKPHOS 51 47 46  BILITOT 0.2* 0.5 0.4  PROT 7.1 7.2 6.9  ALBUMIN 3.2* 3.2* 2.9*   No results for input(s): LIPASE, AMYLASE in the last 168 hours. No results for input(s): AMMONIA in the last 168 hours. Coagulation Profile: No results for input(s): INR, PROTIME in the last 168 hours. Cardiac Enzymes: No results for input(s): CKTOTAL, CKMB, CKMBINDEX, TROPONINI in the last 168 hours. BNP (last 3 results) No results for input(s): PROBNP in the last 8760 hours. HbA1C: Recent Labs    03/17/19 0945  HGBA1C 6.3*   CBG: No results for input(s): GLUCAP in the last 168 hours. Lipid Profile: No results for input(s): CHOL, HDL, LDLCALC, TRIG, CHOLHDL, LDLDIRECT in the last 72 hours. Thyroid Function Tests: No results for input(s): TSH, T4TOTAL, FREET4, T3FREE, THYROIDAB in the last 72 hours. Anemia Panel: Recent Labs    03/18/19 0237 03/19/19 0530  FERRITIN 97 80   Urine analysis: No results found for: COLORURINE, APPEARANCEUR, LABSPEC, PHURINE, GLUCOSEU, HGBUR, BILIRUBINUR, KETONESUR, PROTEINUR, UROBILINOGEN, NITRITE, LEUKOCYTESUR Recent Results (from the past 240 hour(s))  SARS Coronavirus 2 (CEPHEID- Performed in [facility] hospital lab), Hosp Order     Status: Abnormal   Collection Time: 03/16/19 10:16 PM   Specimen: Nasopharyngeal Swab  Result Value Ref Range Status   SARS Coronavirus 2 POSITIVE (A) NEGATIVE Final    Comment: RESULT CALLED TO, READ BACK BY AND VERIFIED WITH: K WARD RN 03/16/19 2328 JDW (NOTE) If result is NEGATIVE SARS-CoV-2 target nucleic acids are NOT DETECTED. The SARS-CoV-2 RNA is generally detectable in upper and lower  respiratory specimens during the acute phase of infection. The lowest  concentration of SARS-CoV-2 viral copies this assay can detect is 250  copies / mL. A negative result does not preclude SARS-CoV-2 infection  and should not be used as the sole basis for treatment or other  patient management decisions.  A negative  result may occur with  improper specimen collection / handling, submission of specimen other  than nasopharyngeal swab, presence of viral mutation(s) within the  areas targeted by this assay, and inadequate number of viral copies  (<250 copies / mL). A negative result must be combined with clinical  observations, patient history, and epidemiological information. If result is POSITIVE SARS-CoV-2 target nucleic acids are DETECTED. The SARS-C oV-2 RNA is generally detectable in upper and lower  respiratory specimens during the acute phase of infection.  Positive  results are indicative of active infection with SARS-CoV-2.  Clinical  correlation with patient history and other diagnostic information is  necessary to determine patient infection status.  Positive results do  not rule out bacterial infection or co-infection with other viruses. If result is PRESUMPTIVE POSTIVE SARS-CoV-2 nucleic acids MAY BE PRESENT.   A presumptive positive result was obtained on the submitted specimen  and confirmed on repeat testing.  While 2019 novel coronavirus  (SARS-CoV-2) nucleic acids may be present in the submitted sample  additional confirmatory testing may  be necessary for epidemiological  and / or clinical management purposes  to differentiate between  SARS-CoV-2 and other Sarbecovirus currently known to infect humans.  If clinically indicated additional testing with an alternate test  methodology [phone]([id number]) is advised.  The SARS-CoV-2 RNA is generally  detectable in upper and lower respiratory specimens during the acute  phase of infection. The expected result is Negative. Fact Sheet for Patients:  BoilerBrush.com.cyhttps://www.fda.gov/media/136312/download Fact Sheet for Healthcare Providers: https://pope.com/https://www.fda.gov/media/136313/download This test is not yet approved or cleared by the [place] FDA and has been authorized for detection and/or diagnosis of SARS-CoV-2 by FDA under an Emergency Use Authorization  (EUA).  This EUA will remain in effect (meaning this test can be used) for the duration of the COVID-19 declaration under Section 564(b)(1) of the Act, 21 U.S.C. section 360bbb-3(b)(1), unless the authorization is terminated or revoked sooner. Performed at [facility], [address] [place], [place] [address]       Radiology Studies: No results found.  Scheduled Meds: . cholecalciferol  1,000 Units Oral Daily  . enoxaparin (LOVENOX) injection  60 mg Subcutaneous Q24H  . ipratropium  2 puff Inhalation BID  . levalbuterol  2 puff Inhalation BID  . methylPREDNISolone (SOLU-MEDROL) injection  60 mg Intravenous Daily  . vitamin C  500 mg Oral Daily  . zinc sulfate  220 mg Oral Daily   Continuous Infusions: . remdesivir 100 mg in NS 250 mL 100 mg (03/18/19 1250)     LOS: 2 days   Time spent: 35 minutes.  Tyrone Nineyan B Ashaz Robling, MD [facility] [email or web address] Password [email or web address] 03/19/2019, 9:31 AM

## 2019-03-19 NOTE — Progress Notes (Signed)
Nutrition Education Consult  RD working remotely. Consulted for nutrition education regarding morbid obesity and pre-diabetes.  Body mass index is 43.26 kg/m. Pt meets criteria for class 3, extreme/morbid obesity based on current BMI.  Hemoglobin A1C 6.3 (H)  RD discussed weight loss tips for home diet as well as diet tips to control blood glucose. Patient stated she was planning to start following a vegan diet when she is discharged home. Discussed ways to include whole proteins in her vegan diet.   Discussed importance of controlled and consistent intake throughout the day.   Emphasized the importance of hydration with calorie-free beverages and limiting sugar-sweetened beverages. Encouraged pt to discuss physical activity options with physician. Teach back method used.  Expect good compliance.  Current diet order is regular, patient is consuming approximately 100% of meals at this time. Labs and medications reviewed. RD will mail diet education materials to patient's home. No further nutrition interventions warranted at this time. If additional nutrition issues arise, please re-consult RD.  Molli Barrows, RD, LDN, [place] Pager [phone] After Hours Pager [phone]

## 2019-03-20 LAB — FERRITIN: Ferritin: 87 ng/mL (ref 11–307)

## 2019-03-20 LAB — COMPREHENSIVE METABOLIC PANEL
ALT: 61 U/L — ABNORMAL HIGH (ref 0–44)
AST: 61 U/L — ABNORMAL HIGH (ref 15–41)
Albumin: 2.9 g/dL — ABNORMAL LOW (ref 3.5–5.0)
Alkaline Phosphatase: 52 U/L (ref 38–126)
Anion gap: 9 (ref 5–15)
BUN: 19 mg/dL (ref 6–20)
CO2: 30 mmol/L (ref 22–32)
Calcium: 8.2 mg/dL — ABNORMAL LOW (ref 8.9–10.3)
Chloride: 102 mmol/L (ref 98–111)
Creatinine, Ser: 0.68 mg/dL (ref 0.44–1.00)
GFR calc Af Amer: >60 mL/min (ref >60)
GFR calc non Af Amer: >60 mL/min (ref >60)
Glucose, Bld: 105 mg/dL — ABNORMAL HIGH (ref 70–99)
Potassium: 4 mmol/L (ref 3.5–5.1)
Sodium: 141 mmol/L (ref 135–145)
Total Bilirubin: 0.2 mg/dL — ABNORMAL LOW (ref 0.3–1.2)
Total Protein: 6.9 g/dL (ref 6.5–8.1)

## 2019-03-20 LAB — D-DIMER, QUANTITATIVE: D-Dimer, Quant: 0.35 ug/mL-FEU (ref 0.00–0.50)

## 2019-03-20 LAB — CBC WITH DIFFERENTIAL/PLATELET
Abs Immature Granulocytes: 0.05 10*3/uL (ref 0.00–0.07)
Basophils Absolute: 0 10*3/uL (ref 0.0–0.1)
Basophils Relative: 0 %
Eosinophils Absolute: 0 10*3/uL (ref 0.0–0.5)
Eosinophils Relative: 0 %
HCT: 30 % — ABNORMAL LOW (ref 36.0–46.0)
Hemoglobin: 8.7 g/dL — ABNORMAL LOW (ref 12.0–15.0)
Immature Granulocytes: 1 %
Lymphocytes Relative: 16 %
Lymphs Abs: 1 10*3/uL (ref 0.7–4.0)
MCH: 21.5 pg — ABNORMAL LOW (ref 26.0–34.0)
MCHC: 29 g/dL — ABNORMAL LOW (ref 30.0–36.0)
MCV: 74.1 fL — ABNORMAL LOW (ref 80.0–100.0)
Monocytes Absolute: 0.3 10*3/uL (ref 0.1–1.0)
Monocytes Relative: 5 %
Neutro Abs: 4.7 10*3/uL (ref 1.7–7.7)
Neutrophils Relative %: 78 %
Platelets: 364 10*3/uL (ref 150–400)
RBC: 4.05 MIL/uL (ref 3.87–5.11)
RDW: 18.7 % — ABNORMAL HIGH (ref 11.5–15.5)
WBC: 6 10*3/uL (ref 4.0–10.5)
nRBC: 0 % (ref 0.0–0.2)

## 2019-03-20 LAB — C-REACTIVE PROTEIN: CRP: 8.8 mg/dL — ABNORMAL HIGH (ref <1.0)

## 2019-03-20 NOTE — Progress Notes (Signed)
PROGRESS NOTE  Elizabeth Monsthena Y Ofallon  ZOX:[id number] DOB: 09/04/1976 DOA: 03/16/2019 PCP: [facility]   Brief Narrative: Elizabeth [name] is a 43 y.o. female with a history of obesity and HTN who presented to the ED with worsening shortness of breath for the previous week after exposure to sick contacts at a funeral around 03/01/2019. She was found to have infiltrates on CXR, requiring supplemental oxygen, and tested positive for covid-19. [facility], given IV steroids. Remdesivir subsequently started (6/27 - 7/1). Remains hypoxic.  Assessment & Plan: Principal Problem:   Pneumonia due to COVID-19 virus Active Problems:   Hypertension   Obesity   Acute respiratory failure with hypoxia (HCC)  Acute hypoxia respiratory failure due to covid-19 pneumonia: Now entering 2nd week of symptoms. Obesity and HTN RF's for worsening.  - Continue steroids - Continue remdesivir (6/27 - 7/1) - No indication for further investigational/off-label therapies at this time.  - Checking CRP, improving trend. Stop checking ferritin, d-dimer.  - Maintain euvolemia/net negative.  - Avoid NSAIDs - Recommend proning and aggressive use of incentive spirometry. Remains hypoxic. - hs-Troponin modestly abnormal consistent with demand due to infection. No concerning ST elevation or depression on ECG and no chest pain. No further evaluation planned.  Morbid obesity: BMI 43.  - Dietitian consult   Prediabetes: HbA1c 6.3%. No significant hyperglycemia on labs so far. - Dietitian consult  HTN: Currently normotensive.   DVT prophylaxis: Lovenox Code Status: Full Family Communication: None at bedside, pt to relay POC to family Disposition Plan: Home once improved, possibly 7/1.  Consultants:   None  Procedures:   None  Antimicrobials:  Remdesivir 6/27 - 7/1  Subjective: Wants to go home. She is not sleeping well, and still has shortness of breath with exertion.  No chest pain or other complaints. No fevers.   Objective: Vitals:   03/19/19 1928 03/20/19 0600 03/20/19 0730 03/20/19 1204  BP: (!) 144/86 124/65 108/64   Pulse: 69 60 65   Resp: 20 20    Temp: 99 F (37.2 C) 98.3 F (36.8 C) 98.8 F (37.1 C)   TempSrc: Oral Oral    SpO2: 92% 91% (!) 89% 93%  Weight:      Height:        Intake/Output Summary (Last 24 hours) at 03/20/2019 1222 Last data filed at 03/20/2019 0300 Gross per 24 hour  Intake 881.56 ml  Output 2 ml  Net 879.56 ml   Filed Weights   03/16/19 1746  Weight: 121.6 kg   Gen: 43 y.o. female in no distress Pulm: Nonlabored breathing supplemental oxygen at rest. Clear. CV: Regular rate and rhythm. No murmur, rub, or gallop. No JVD, no dependent edema. GI: Abdomen soft, non-tender, non-distended, with normoactive bowel sounds.  Ext: Warm, no deformities Skin: No rashes, lesions or ulcers on visualized skin. Neuro: Alert and oriented. No focal neurological deficits. Psych: Judgement and insight appear fair. Mood euthymic & affect congruent. Behavior is appropriate.    Data Reviewed: I have personally reviewed following labs and imaging studies  CBC: Recent Labs  Lab 03/16/19 1800 03/17/19 0945 03/18/19 0237 03/19/19 0530 03/20/19 0257  WBC 5.8 5.8 4.5 6.4 6.0  NEUTROABS  --  4.9 2.9 4.8 4.7  HGB 9.3* 8.5* 8.4* 8.2* 8.7*  HCT 32.6* 29.9* 29.6* 29.1* 30.0*  MCV 72.9* 73.8* 74.0* 74.2* 74.1*  PLT 338 315 309 336 364   Basic Metabolic Panel: Recent Labs  Lab 03/16/19 1800 03/17/19  0945 03/18/19 0237 03/19/19 0530 03/20/19 0257  NA 131* 135 139 139 141  K 3.3* 3.8 3.8 4.0 4.0  CL 95* 97* 99 101 102  CO2 24 26 29 28 30   GLUCOSE 122* 130* 116* 104* 105*  BUN 6 11 14 18 19   CREATININE 1.22* 0.95 0.77 0.60 0.68  CALCIUM 7.6* 7.8* 8.3* 7.9* 8.2*   GFR: Estimated Creatinine Clearance: 123 mL/min (by C-G formula based on SCr of 0.68 mg/dL). Liver Function Tests: Recent Labs  Lab 03/17/19 0945 03/18/19  0237 03/19/19 0530 03/20/19 0257  AST 45* 40 34 61*  ALT 25 29 30  61*  ALKPHOS 51 47 46 52  BILITOT 0.2* 0.5 0.4 0.2*  PROT 7.1 7.2 6.9 6.9  ALBUMIN 3.2* 3.2* 2.9* 2.9*   Anemia Panel: Recent Labs    03/19/19 0530 03/20/19 0257  FERRITIN 80 87    Recent Results (from the past 240 hour(s))  SARS Coronavirus 2 (CEPHEID- Performed in [facility] hospital lab), Hosp Order     Status: Abnormal   Collection Time: 03/16/19 10:16 PM   Specimen: Nasopharyngeal Swab  Result Value Ref Range Status   SARS Coronavirus 2 POSITIVE (A) NEGATIVE Final    Comment: RESULT CALLED TO, READ BACK BY AND VERIFIED WITH: K WARD RN 03/16/19 2328 JDW (NOTE) If result is NEGATIVE SARS-CoV-2 target nucleic acids are NOT DETECTED. The SARS-CoV-2 RNA is generally detectable in upper and lower  respiratory specimens during the acute phase of infection. The lowest  concentration of SARS-CoV-2 viral copies this assay can detect is 250  copies / mL. A negative result does not preclude SARS-CoV-2 infection  and should not be used as the sole basis for treatment or other  patient management decisions.  A negative result may occur with  improper specimen collection / handling, submission of specimen other  than nasopharyngeal swab, presence of viral mutation(s) within the  areas targeted by this assay, and inadequate number of viral copies  (<250 copies / mL). A negative result must be combined with clinical  observations, patient history, and epidemiological information. If result is POSITIVE SARS-CoV-2 target nucleic acids are DETECTED. The SARS-C oV-2 RNA is generally detectable in upper and lower  respiratory specimens during the acute phase of infection.  Positive  results are indicative of active infection with SARS-CoV-2.  Clinical  correlation with patient history and other diagnostic information is  necessary to determine patient infection status.  Positive results do  not rule out bacterial  infection or co-infection with other viruses. If result is PRESUMPTIVE POSTIVE SARS-CoV-2 nucleic acids MAY BE PRESENT.   A presumptive positive result was obtained on the submitted specimen  and confirmed on repeat testing.  While 2019 novel coronavirus  (SARS-CoV-2) nucleic acids may be present in the submitted sample  additional confirmatory testing may be necessary for epidemiological  and / or clinical management purposes  to differentiate between  SARS-CoV-2 and other Sarbecovirus currently known to infect humans.  If clinically indicated additional testing with an alternate test  methodology [phone]([id number]) is advised.  The SARS-CoV-2 RNA is generally  detectable in upper and lower respiratory specimens during the acute  phase of infection. The expected result is Negative. Fact Sheet for Patients:  BoilerBrush.com.cyhttps://www.fda.gov/media/136312/download Fact Sheet for Healthcare Providers: https://pope.com/https://www.fda.gov/media/136313/download This test is not yet approved or cleared by the [place] FDA and has been authorized for detection and/or diagnosis of SARS-CoV-2 by FDA under an Emergency Use Authorization (EUA).  This EUA will remain in effect (  meaning this test can be used) for the duration of the COVID-19 declaration under Section 564(b)(1) of the Act, 21 U.S.C. section 360bbb-3(b)(1), unless the authorization is terminated or revoked sooner. Performed at [facility], [place] [address] [place], [place] [address]       Radiology Studies: No results found.  Scheduled Meds: . cholecalciferol  1,000 Units Oral Daily  . enoxaparin (LOVENOX) injection  60 mg Subcutaneous Q24H  . ipratropium  2 puff Inhalation BID  . levalbuterol  2 puff Inhalation BID  . methylPREDNISolone (SOLU-MEDROL) injection  60 mg Intravenous Daily  . vitamin C  500 mg Oral Daily  . zinc sulfate  220 mg Oral Daily   Continuous Infusions: . remdesivir 100 mg in NS 250 mL 100 mg (03/20/19 1144)     LOS: 3 days    Time spent: 35 minutes.  Patrecia Pour, MD [facility] [email or web address] Password [email or web address] 03/20/2019, 12:22 PM

## 2019-03-21 LAB — COMPREHENSIVE METABOLIC PANEL
ALT: 44 U/L (ref 0–44)
AST: 24 U/L (ref 15–41)
Albumin: 3 g/dL — ABNORMAL LOW (ref 3.5–5.0)
Alkaline Phosphatase: 52 U/L (ref 38–126)
Anion gap: 9 (ref 5–15)
BUN: 16 mg/dL (ref 6–20)
CO2: 29 mmol/L (ref 22–32)
Calcium: 8.2 mg/dL — ABNORMAL LOW (ref 8.9–10.3)
Chloride: 100 mmol/L (ref 98–111)
Creatinine, Ser: 0.63 mg/dL (ref 0.44–1.00)
GFR calc Af Amer: >60 mL/min (ref >60)
GFR calc non Af Amer: >60 mL/min (ref >60)
Glucose, Bld: 90 mg/dL (ref 70–99)
Potassium: 3.8 mmol/L (ref 3.5–5.1)
Sodium: 138 mmol/L (ref 135–145)
Total Bilirubin: 0.3 mg/dL (ref 0.3–1.2)
Total Protein: 7.3 g/dL (ref 6.5–8.1)

## 2019-03-21 LAB — CBC WITH DIFFERENTIAL/PLATELET
Abs Immature Granulocytes: 0.08 10*3/uL — ABNORMAL HIGH (ref 0.00–0.07)
Basophils Absolute: 0 10*3/uL (ref 0.0–0.1)
Basophils Relative: 0 %
Eosinophils Absolute: 0 10*3/uL (ref 0.0–0.5)
Eosinophils Relative: 0 %
HCT: 32.1 % — ABNORMAL LOW (ref 36.0–46.0)
Hemoglobin: 9.2 g/dL — ABNORMAL LOW (ref 12.0–15.0)
Immature Granulocytes: 1 %
Lymphocytes Relative: 21 %
Lymphs Abs: 1.9 10*3/uL (ref 0.7–4.0)
MCH: 21.3 pg — ABNORMAL LOW (ref 26.0–34.0)
MCHC: 28.7 g/dL — ABNORMAL LOW (ref 30.0–36.0)
MCV: 74.3 fL — ABNORMAL LOW (ref 80.0–100.0)
Monocytes Absolute: 0.5 10*3/uL (ref 0.1–1.0)
Monocytes Relative: 5 %
Neutro Abs: 7 10*3/uL (ref 1.7–7.7)
Neutrophils Relative %: 73 %
Platelets: 517 10*3/uL — ABNORMAL HIGH (ref 150–400)
RBC: 4.32 MIL/uL (ref 3.87–5.11)
RDW: 18.6 % — ABNORMAL HIGH (ref 11.5–15.5)
WBC: 9.5 10*3/uL (ref 4.0–10.5)
nRBC: 0.4 % — ABNORMAL HIGH (ref 0.0–0.2)

## 2019-03-21 LAB — C-REACTIVE PROTEIN: CRP: 6.4 mg/dL — ABNORMAL HIGH (ref <1.0)

## 2019-03-21 MED ORDER — ENOXAPARIN SODIUM 40 MG/0.4ML ~~LOC~~ SOLN: 40.0000 mg | SUBCUTANEOUS | Status: DC

## 2019-03-21 MED ORDER — PREDNISONE 20 MG PO TABS: 40.0000 mg | ORAL_TABLET | Freq: Every day | ORAL | Status: DC

## 2019-03-21 MED ORDER — ENOXAPARIN SODIUM 60 MG/0.6ML ~~LOC~~ SOLN
60.0000 mg | SUBCUTANEOUS | Status: DC
  Administered 2019-03-21: 60 mg via SUBCUTANEOUS
  Filled 2019-03-21 (×2): qty 0.6

## 2019-03-21 MED ORDER — ACETAMINOPHEN 325 MG PO TABS: 650.0000 mg | ORAL_TABLET | Freq: Four times a day (QID) | ORAL | Status: AC | PRN

## 2019-03-21 MED ORDER — PREDNISONE 20 MG PO TABS: ORAL_TABLET | ORAL | 0 refills | Status: AC

## 2019-03-21 NOTE — Discharge Summary (Signed)
DISCHARGE SUMMARY  Elizabeth [name]  MR#: [id number]  DOB:01/20/1976  Date of Admission: 03/16/2019 Date of Discharge: 03/21/2019  Attending Physician:Louie Meaders Silvestre Gunner Javaeh Muscatello, MD  Patient's JYN:WGNFPCP:[facility]  Consults: None  Disposition: D/C home   Follow-up Appts: Follow-up Information    [facility] Follow up.   Why: home oxygen Contact information: [address] [place] [address] [phone]        [facility]. Schedule an appointment as soon as possible for a visit in 5 day(s).   Contact information: [address] [place] [address] [phone]           Tests Needing Follow-up: -Assess need for ongoing oxygen therapy  Discharge Diagnoses: COVID pneumonia Acute hypoxic respiratory failure Demand ischemia Morbid obesity Prediabetes HTN  Initial presentation: 43yo w/ a hx of Obesity and HTN who presented w/ 7 days of worsening shortness of breath after exposure to sick contacts at a funeral 03/01/2019. She was found to have infiltrates on CXR, requiring supplemental oxygen, and tested positive for SARS-CoV-2. [facility] given IV steroids. Remdesivir subsequently started (6/27 - 7/1).   Hospital Course:  COVID PNA - Acute hypoxic respiratory failure Completed a course of Remdesivir - steroids continue on short taper at time of discharge -the patient requires short-term use of supportive oxygen therapy at time of discharge as she is desaturating below 88% both at rest and when ambulating  Mildly elevated troponin  Most consistent with demand ischemia in setting of severe infection - no acute ST changes noted on EKG  Obesity - Body mass index is 43.26 kg/m.   Prediabetes A1c 6.3 - no significant hyperglycemia during this admission  HTN Blood pressure reasonably controlled during this admission   Allergies as of 03/21/2019   No Known Allergies      Medication List    STOP taking these medications   azithromycin 250 MG tablet Commonly known as: ZITHROMAX     TAKE these medications   acetaminophen 325 MG tablet Commonly known as: TYLENOL Take 2 tablets (650 mg total) by mouth every 6 (six) hours as needed for mild pain, fever or headache.   amLODipine 10 MG tablet Commonly known as: NORVASC Take 1 tablet (10 mg total) by mouth daily.   predniSONE 20 MG tablet Commonly known as: DELTASONE Take 2 tablets (40 mg total) by mouth daily with breakfast for 2 days, THEN 1 tablet (20 mg total) daily with breakfast for 2 days, THEN 0.5 tablets (10 mg total) daily with breakfast for 2 days. Start taking on: March 22, 2019            Durable Medical Equipment  (From admission, onward)         Start     Ordered   03/21/19 1203  For home use only DME oxygen  Once    Question Answer Comment  Length of Need 6 Months   Mode or (Route) Nasal cannula   Liters per Minute 3   Oxygen delivery system Gas      03/21/19 1202          Day of Discharge BP (!) 159/78   Pulse 64   Temp 99 F (37.2 C)   Resp 20   Ht 5\' 6"  (1.676 m)   Wt 121.6 kg   LMP 02/21/2019 (Approximate)   SpO2 93%   BMI 43.26 kg/m   Physical Exam: General: No acute respiratory distress  Lungs: Fine diffuse crackles worse in bilateral bases with no wheezing Cardiovascular: Regular rate and rhythm without murmur gallop or rub normal S1 and S2 Abdomen: Nontender, obese, soft, bowel sounds positive, no rebound Extremities: No significant cyanosis, clubbing, or edema bilateral lower extremities  Basic Metabolic Panel: Recent Labs  Lab 03/17/19 0945 03/18/19 0237 03/19/19 0530 03/20/19 0257 03/21/19 0446  NA 135 139 139 141 138  K 3.8 3.8 4.0 4.0 3.8  CL 97* 99 101 102 100  CO2 26 29 28 30 29   GLUCOSE 130* 116* 104* 105* 90  BUN 11 14 18 19 16   CREATININE 0.95 0.77 0.60 0.68 0.63  CALCIUM 7.8* 8.3* 7.9* 8.2* 8.2*    Liver Function Tests:  Recent Labs  Lab 03/17/19 0945 03/18/19 0237 03/19/19 0530 03/20/19 0257 03/21/19 0446  AST 45* 40 34 61* 24  ALT 25 29 30  61* 44  ALKPHOS 51 47 46 52 52  BILITOT 0.2* 0.5 0.4 0.2* 0.3  PROT 7.1 7.2 6.9 6.9 7.3  ALBUMIN 3.2* 3.2* 2.9* 2.9* 3.0*    CBC: Recent Labs  Lab 03/17/19 0945 03/18/19 0237 03/19/19 0530 03/20/19 0257 03/21/19 0446  WBC 5.8 4.5 6.4 6.0 9.5  NEUTROABS 4.9 2.9 4.8 4.7 7.0  HGB 8.5* 8.4* 8.2* 8.7* 9.2*  HCT 29.9* 29.6* 29.1* 30.0* 32.1*  MCV 73.8* 74.0* 74.2* 74.1* 74.3*  PLT 315 309 336 364 517*    BNP (last 3 results) Recent Labs    03/16/19 2228  BNP 41.1    Recent Results (from the past 240 hour(s))  SARS Coronavirus 2 (CEPHEID- Performed in [facility] hospital lab), Hosp Order     Status: Abnormal   Collection Time: 03/16/19 10:16 PM   Specimen: Nasopharyngeal Swab  Result Value Ref Range Status   SARS Coronavirus 2 POSITIVE (A) NEGATIVE Final    Comment: RESULT CALLED TO, READ BACK BY AND VERIFIED WITH: K WARD RN 03/16/19 2328 JDW (NOTE) If result is NEGATIVE SARS-CoV-2 target nucleic acids are NOT DETECTED. The SARS-CoV-2 RNA is generally detectable in upper and lower  respiratory specimens during the acute phase of infection. The lowest  concentration of SARS-CoV-2 viral copies this assay can detect is 250  copies / mL. A negative result does not preclude SARS-CoV-2 infection  and should not be used as the sole basis for treatment or other  patient management decisions.  A negative result may occur with  improper specimen collection / handling, submission of specimen other  than nasopharyngeal swab, presence of viral mutation(s) within the  areas targeted by this assay, and inadequate number of viral copies  (<250 copies / mL). A negative result must be combined with clinical  observations, patient history, and epidemiological information. If result is POSITIVE SARS-CoV-2 target nucleic acids are DETECTED. The SARS-C oV-2 RNA  is generally detectable in upper and lower  respiratory specimens during the acute phase of infection.  Positive  results are indicative of active infection with SARS-CoV-2.  Clinical  correlation with patient history and other diagnostic information is  necessary to determine patient infection status.  Positive results do  not rule out bacterial infection or co-infection with other viruses. If result is PRESUMPTIVE POSTIVE SARS-CoV-2 nucleic acids MAY BE PRESENT.   A presumptive positive result was obtained on the submitted specimen  and confirmed on repeat testing.  While 2019 novel coronavirus  (SARS-CoV-2) nucleic acids may be present in the submitted sample  additional confirmatory testing may be necessary for epidemiological  and /  or clinical management purposes  to differentiate between  SARS-CoV-2 and other Sarbecovirus currently known to infect humans.  If clinically indicated additional testing with an alternate test  methodology [phone]([id number]) is advised.  The SARS-CoV-2 RNA is generally  detectable in upper and lower respiratory specimens during the acute  phase of infection. The expected result is Negative. Fact Sheet for Patients:  BoilerBrush.com.cyhttps://www.fda.gov/media/136312/download Fact Sheet for Healthcare Providers: https://pope.com/https://www.fda.gov/media/136313/download This test is not yet approved or cleared by the [place] FDA and has been authorized for detection and/or diagnosis of SARS-CoV-2 by FDA under an Emergency Use Authorization (EUA).  This EUA will remain in effect (meaning this test can be used) for the duration of the COVID-19 declaration under Section 564(b)(1) of the Act, 21 U.S.C. section 360bbb-3(b)(1), unless the authorization is terminated or revoked sooner. Performed at [facility], [address] [place], [place] [address]       Time spent in discharge (includes decision making & examination of pt): 35 minutes  03/21/2019, 3:27 PM   Lonia BloodJeffrey T.  Earnest Mcgillis, MD [facility] Office  [phone]

## 2019-03-21 NOTE — TOC Transition Note (Signed)
Transition of Care [facility]) - CM/SW Discharge Note   Patient Details  Name: Elizabeth [name] MRN: [id number] Date of Birth: [birth year]  [facility] CM/SW Contact:  Midge Minium RN, BSN, NCM-BC, ACM-RN [phone] (working remotely) Phone Number: 03/21/2019, 11:53 AM   Clinical Narrative:    CM following for dispositional needs. CM contacted the patient via phone to discuss the POC. Patient states living at home with her spouse and being independent with her ADLs PTA. Patient confirmed having a PCP: [facility], but no active health insurance. Patient will need home [employer] oxygen at discharge, with patient stated her spouse is home and available for delivery of the oxygen concentrator. Patient will be provided with a portable oxygen tank from [employer] and a thermometer/pulse ox device before discharge. Patient indicated her spouse will provide transportation home. No further needs from CM.    Final next level of care: Home/Self Care Barriers to Discharge: No Barriers Identified   Patient Goals and CMS Choice Patient states their goals for this hospitalization and ongoing recovery are:: "to return home" CMS Medicare.gov Compare Post Acute Care list provided to:: Patient Choice offered to / list presented to : Patient([facility] DME)   Discharge Plan and Services DME Agency: [place] Date DME Agency Contacted: 03/21/19 Time DME Agency Contacted: [id number] Representative spoke with at DME Agency: Learta Codding liaison HH Arranged: NA [place] Agency: NA  Social Determinants of Health (SDOH) Interventions     Readmission Risk Interventions No flowsheet data found.

## 2019-03-21 NOTE — Plan of Care (Signed)

## 2019-03-21 NOTE — Progress Notes (Signed)
SATURATION QUALIFICATIONS: (This note is used to comply with regulatory documentation for home oxygen)  Patient Saturations on Room Air at Rest = 84%  Patient Saturations on Room Air while Ambulating = 82%  Patient Saturations on 2 Liters of oxygen while Ambulating = 88-90%  Patient Saturations on 3 Liters of oxygen while Ambulating = 91-94%

## 2019-03-21 NOTE — Progress Notes (Signed)
Education completed with Pt. O2 set up at home, and Pt ready for discharge.. Pt take to front of hospital with home 02, and equipment needed for discharge with instructions.  Pt picked up by husband.

## 2019-03-21 NOTE — Progress Notes (Signed)
   03/21/19   To Whom it may concern,  Elizabeth [name] was admitted to [facility] on 03/16/2019  and remained under my care in the hospital through 03/21/19.  She has been advised that she should not return to work until 03/26/2019, at which time she will be cleared to resume all of her usual responsibilities.  Sincerely,  Cherene Altes, MD [facility] Office  [phone]

## 2019-03-21 NOTE — Discharge Instructions (Signed)
Person Under Monitoring Name: Elizabeth [name]  Location: [place] [address]   Record here the list of visitors to your home since you became ill with respiratory symptoms that led you to consult a health provider:  Visitor Name Date Time In Time Out Did this person come within 6 feet of you? Indicate Y or N Relationship to Person Under Monitoring Phone number Comments   ___/____/____ __:__ AM/PM __:__ AM/PM       ___/____/____ __:__ AM/PM __:__ AM/PM       ___/____/____ __:__ AM/PM __:__ AM/PM       ___/____/____ __:__ AM/PM __:__ AM/PM       ___/____/____ __:__ AM/PM __:__ AM/PM       ___/____/____ __:__ AM/PM __:__ AM/PM       ___/____/____ __:__ AM/PM __:__ AM/PM       ___/____/____ __:__ AM/PM __:__ AM/PM       ___/____/____ __:__ AM/PM __:__ AM/PM       ___/____/____ __:__ AM/PM __:__ AM/PM       ___/____/____ __:__ AM/PM __:__ AM/PM       ___/____/____ __:__ AM/PM __:__ AM/PM       ___/____/____ __:__ AM/PM __:__ AM/PM       ___/____/____ __:__ AM/PM __:__ AM/PM       [facility], Division of Public Health, Communicable Disease BranchYOU MAY NOT RETURN TO WORK OR GO OUT IN PUBLIC UNTIL:  You have had no fever for at least 72 hours (that is three full days of no fever without the use of medicine that reduces fevers) AND other symptoms have improved (for example, when your cough or shortness of breath or diarrhea have improved) AND at least 10 days have passed since your symptoms first appeared    COVID-19 COVID-19 is a respiratory infection that is caused by a virus called severe acute respiratory syndrome coronavirus 2 (SARS-CoV-2). The disease is also known as coronavirus disease or novel coronavirus. In some people, the virus may not cause any symptoms. In others, it may cause a serious infection. The infection can get worse quickly and can lead to complications, such as:  Pneumonia, or infection of the lungs.  Acute  respiratory distress syndrome or ARDS. This is fluid build-up in the lungs.  Acute respiratory failure. This is a condition in which there is not enough oxygen passing from the lungs to the body.  Sepsis or septic shock. This is a serious bodily reaction to an infection.  Blood clotting problems.  Secondary infections due to bacteria or fungus. The virus that causes COVID-19 is contagious. This means that it can spread from person to person through droplets from coughs and sneezes (respiratory secretions). What are the causes? This illness is caused by a virus. You may catch the virus by:  Breathing in droplets from an infected person's cough or sneeze.  Touching something, like a table or a doorknob, that was exposed to the virus (contaminated) and then touching your mouth, nose, or eyes. What increases the risk? Risk for infection You are more likely to be infected with this virus if you:  Live in or travel to an area with a COVID-19 outbreak.  Come in contact with a sick person who recently traveled to an area with a COVID-19 outbreak.  Provide care for or live with a person who is infected with COVID-19. Risk for serious illness You are more likely to become seriously ill from the virus if you:  Are 65  years of age or older.  Have a long-term disease that lowers your body's ability to fight infection (immunocompromised).  Live in a nursing home or long-term care facility.  Have a long-term (chronic) disease such as: ? Chronic lung disease, including chronic obstructive pulmonary disease or asthma ? Heart disease. ? Diabetes. ? Chronic kidney disease. ? Liver disease.  Are obese. What are the signs or symptoms? Symptoms of this condition can range from mild to severe. Symptoms may appear any time from 2 to 14 days after being exposed to the virus. They include:  A fever.  A cough.  Difficulty breathing.  Chills.  Muscle pains.  A sore throat.  Loss of taste  or smell. Some people may also have stomach problems, such as nausea, vomiting, or diarrhea. Other people may not have any symptoms of COVID-19. How is this diagnosed? This condition may be diagnosed based on:  Your signs and symptoms, especially if: ? You live in an area with a COVID-19 outbreak. ? You recently traveled to or from an area where the virus is common. ? You provide care for or live with a person who was diagnosed with COVID-19.  A physical exam.  Lab tests, which may include: ? A nasal swab to take a sample of fluid from your nose. ? A throat swab to take a sample of fluid from your throat. ? A sample of mucus from your lungs (sputum). ? Blood tests.  Imaging tests, which may include, X-rays, CT scan, or ultrasound. How is this treated? At present, there is no medicine to treat COVID-19. Medicines that treat other diseases are being used on a trial basis to see if they are effective against COVID-19. Your health care provider will talk with you about ways to treat your symptoms. For most people, the infection is mild and can be managed at home with rest, fluids, and over-the-counter medicines. Treatment for a serious infection usually takes places in a hospital intensive care unit (ICU). It may include one or more of the following treatments. These treatments are given until your symptoms improve.  Receiving fluids and medicines through an IV.  Supplemental oxygen. Extra oxygen is given through a tube in the nose, a face mask, or a hood.  Positioning you to lie on your stomach (prone position). This makes it easier for oxygen to get into the lungs.  Continuous positive airway pressure (CPAP) or bi-level positive airway pressure (BPAP) machine. This treatment uses mild air pressure to keep the airways open. A tube that is connected to a motor delivers oxygen to the body.  Ventilator. This treatment moves air into and out of the lungs by using a tube that is placed in  your windpipe.  Tracheostomy. This is a procedure to create a hole in the neck so that a breathing tube can be inserted.  Extracorporeal membrane oxygenation (ECMO). This procedure gives the lungs a chance to recover by taking over the functions of the heart and lungs. It supplies oxygen to the body and removes carbon dioxide. Follow these instructions at home: Lifestyle  If you are sick, stay home except to get medical care. Your health care provider will tell you how long to stay home. Call your health care provider before you go for medical care.  Rest at home as told by your health care provider.  Do not use any products that contain nicotine or tobacco, such as cigarettes, e-cigarettes, and chewing tobacco. If you need help quitting, ask your health  care provider.  Return to your normal activities as told by your health care provider. Ask your health care provider what activities are safe for you. General instructions  Take over-the-counter and prescription medicines only as told by your health care provider.  Drink enough fluid to keep your urine pale yellow.  Keep all follow-up visits as told by your health care provider. This is important. How is this prevented?  There is no vaccine to help prevent COVID-19 infection. However, there are steps you can take to protect yourself and others from this virus. To protect yourself:   Do not travel to areas where COVID-19 is a risk. The areas where COVID-19 is reported change often. To identify high-risk areas and travel restrictions, check the CDC travel website: StageSync.siwwwnc.cdc.gov/travel/notices  If you live in, or must travel to, an area where COVID-19 is a risk, take precautions to avoid infection. ? Stay away from people who are sick. ? Wash your hands often with soap and water for 20 seconds. If soap and water are not available, use an alcohol-based hand sanitizer. ? Avoid touching your mouth, face, eyes, or nose. ? Avoid going out in  public, follow guidance from your state and local health authorities. ? If you must go out in public, wear a cloth face covering or face mask. ? Disinfect objects and surfaces that are frequently touched every day. This may include:  Counters and tables.  Doorknobs and light switches.  Sinks and faucets.  Electronics, such as phones, remote controls, keyboards, computers, and tablets. To protect others: If you have symptoms of COVID-19, take steps to prevent the virus from spreading to others.  If you think you have a COVID-19 infection, contact your health care provider right away. Tell your health care team that you think you may have a COVID-19 infection.  Stay home. Leave your house only to seek medical care. Do not use public transport.  Do not travel while you are sick.  Wash your hands often with soap and water for 20 seconds. If soap and water are not available, use alcohol-based hand sanitizer.  Stay away from other members of your household. Let healthy household members care for children and pets, if possible. If you have to care for children or pets, wash your hands often and wear a mask. If possible, stay in your own room, separate from others. Use a different bathroom.  Make sure that all people in your household wash their hands well and often.  Cough or sneeze into a tissue or your sleeve or elbow. Do not cough or sneeze into your hand or into the air.  Wear a cloth face covering or face mask. Where to find more information  Centers for Disease Control and Prevention: StickerEmporium.tnwww.cdc.gov/coronavirus/2019-ncov/index.html  World Health Organization: https://thompson-craig.com/www.who.int/health-topics/coronavirus Contact a health care provider if:  You live in or have traveled to an area where COVID-19 is a risk and you have symptoms of the infection.  You have had contact with someone who has COVID-19 and you have symptoms of the infection. Get help right away if:  You have trouble  breathing.  You have pain or pressure in your chest.  You have confusion.  You have bluish lips and fingernails.  You have difficulty waking from sleep.  You have symptoms that get worse. These symptoms may represent a serious problem that is an emergency. Do not wait to see if the symptoms will go away. Get medical help right away. Call your local emergency services (911  in the [place] Do not drive yourself to the hospital. Let the emergency medical personnel know if you think you have COVID-19. Summary  COVID-19 is a respiratory infection that is caused by a virus. It is also known as coronavirus disease or novel coronavirus. It can cause serious infections, such as pneumonia, acute respiratory distress syndrome, acute respiratory failure, or sepsis.  The virus that causes COVID-19 is contagious. This means that it can spread from person to person through droplets from coughs and sneezes.  You are more likely to develop a serious illness if you are 65 years of age or older, have a weak immunity, live in a nursing home, or have chronic disease.  There is no medicine to treat COVID-19. Your health care provider will talk with you about ways to treat your symptoms.  Take steps to protect yourself and others from infection. Wash your hands often and disinfect objects and surfaces that are frequently touched every day. Stay away from people who are sick and wear a mask if you are sick. This information is not intended to replace advice given to you by your health care provider. Make sure you discuss any questions you have with your health care provider. Document Released: 10/12/2018 Document Revised: 02/01/2019 Document Reviewed: 10/12/2018 Elsevier Patient Education  2020 [employer].    COVID-19 Frequently Asked Questions COVID-19 (coronavirus disease) is an infection that is caused by a large family of viruses. Some viruses cause illness in people and others cause illness in animals  like camels, cats, and bats. In some cases, the viruses that cause illness in animals can spread to humans. Where did the coronavirus come from? In December 2019, [place] told the [employer] [facility]) of several cases of lung disease (human respiratory illness). These cases were linked to an open seafood and livestock market in the city of [place]. The link to the seafood and livestock market suggests that the virus may have spread from animals to humans. However, since that first outbreak in December, the virus has also been shown to spread from person to person. What is the name of the disease and the virus? Disease name Early on, this disease was called novel coronavirus. This is because scientists determined that the disease was caused by a new (novel) respiratory virus. The World Health Organization [facility]) has now named the disease COVID-19, or coronavirus disease. Virus name The virus that causes the disease is called severe acute respiratory syndrome coronavirus 2 (SARS-CoV-2). More information on disease and virus naming World Health Organization [facility]): www.who.int/emergencies/diseases/novel-coronavirus-2019/technical-guidance/naming-the-coronavirus-disease-(covid-2019)-and-the-virus-that-causes-it Who is at risk for complications from coronavirus disease? Some people may be at higher risk for complications from coronavirus disease. This includes older adults and people who have chronic diseases, such as heart disease, diabetes, and lung disease. If you are at higher risk for complications, take these extra precautions:  Avoid close contact with people who are sick or have a fever or cough. Stay at least 3-6 ft (1-2 m) away from them, if possible.  Wash your hands often with soap and water for at least 20 seconds.  Avoid touching your face, mouth, nose, or eyes.  Keep supplies on hand at home, such as food, medicine, and cleaning supplies.  Stay home as much as possible.  Avoid  social gatherings and travel. How does coronavirus disease spread? The virus that causes coronavirus disease spreads easily from person to person (is contagious). There are also cases of community-spread disease. This means the disease has spread to:  People who have no known contact with other infected people.  People who have not traveled to areas where there are known cases. It appears to spread from one person to another through droplets from coughing or sneezing. Can I get the virus from touching surfaces or objects? There is still a lot that we do not know about the virus that causes coronavirus disease. Scientists are basing a lot of information on what they know about similar viruses, such as:  Viruses cannot generally survive on surfaces for long. They need a human body (host) to survive.  It is more likely that the virus is spread by close contact with people who are sick (direct contact), such as through: ? Shaking hands or hugging. ? Breathing in respiratory droplets that travel through the air. This can happen when an infected person coughs or sneezes on or near other people.  It is less likely that the virus is spread when a person touches a surface or object that has the virus on it (indirect contact). The virus may be able to enter the body if the person touches a surface or object and then touches his or her face, eyes, nose, or mouth. Can a person spread the virus without having symptoms of the disease? It may be possible for the virus to spread before a person has symptoms of the disease, but this is most likely not the main way the virus is spreading. It is more likely for the virus to spread by being in close contact with people who are sick and breathing in the respiratory droplets of a sick person's cough or sneeze. What are the symptoms of coronavirus disease? Symptoms vary from person to person and can range from mild to severe. Symptoms may  include:  Fever.  Cough.  Tiredness, weakness, or fatigue.  Fast breathing or feeling short of breath. These symptoms can appear anywhere from 2 to 14 days after you have been exposed to the virus. If you develop symptoms, call your health care provider. People with severe symptoms may need hospital care. If I am exposed to the virus, how long does it take before symptoms start? Symptoms of coronavirus disease may appear anywhere from 2 to 14 days after a person has been exposed to the virus. If you develop symptoms, call your health care provider. Should I be tested for this virus? Your health care provider will decide whether to test you based on your symptoms, history of exposure, and your risk factors. How does a health care provider test for this virus? Health care providers will collect samples to send for testing. Samples may include:  Taking a swab of fluid from the nose.  Taking fluid from the lungs by having you cough up mucus (sputum) into a sterile cup.  Taking a blood sample.  Taking a stool or urine sample. Is there a treatment or vaccine for this virus? Currently, there is no vaccine to prevent coronavirus disease. Also, there are no medicines like antibiotics or antivirals to treat the virus. A person who becomes sick is given supportive care, which means rest and fluids. A person may also relieve his or her symptoms by using over-the-counter medicines that treat sneezing, coughing, and runny nose. These are the same medicines that a person takes for the common cold. If you develop symptoms, call your health care provider. People with severe symptoms may need hospital care. What can I do to protect myself and my family from this virus?  You can protect yourself and your family by taking the same actions that you would take to prevent the spread of other viruses. Take the following actions:  Wash your hands often with soap and water for at least 20 seconds. If soap  and water are not available, use alcohol-based hand sanitizer.  Avoid touching your face, mouth, nose, or eyes.  Cough or sneeze into a tissue, sleeve, or elbow. Do not cough or sneeze into your hand or the air. ? If you cough or sneeze into a tissue, throw it away immediately and wash your hands.  Disinfect objects and surfaces that you frequently touch every day.  Avoid close contact with people who are sick or have a fever or cough. Stay at least 3-6 ft (1-2 m) away from them, if possible.  Stay home if you are sick, except to get medical care. Call your health care provider before you get medical care.  Make sure your vaccines are up to date. Ask your health care provider what vaccines you need. What should I do if I need to travel? Follow travel recommendations from your local health authority, the CDC, and WHO. Travel information and advice  Centers for Disease Control and Prevention (CDC): GeminiCard.glwww.cdc.gov/coronavirus/2019-ncov/travelers/index.html  World Health Organization [facility](WHO): PreviewDomains.sewww.who.int/emergencies/diseases/novel-coronavirus-2019/travel-advice Know the risks and take action to protect your health  You are at higher risk of getting coronavirus disease if you are traveling to areas with an outbreak or if you are exposed to travelers from areas with an outbreak.  Wash your hands often and practice good hygiene to lower the risk of catching or spreading the virus. What should I do if I am sick? General instructions to stop the spread of infection  Wash your hands often with soap and water for at least 20 seconds. If soap and water are not available, use alcohol-based hand sanitizer.  Cough or sneeze into a tissue, sleeve, or elbow. Do not cough or sneeze into your hand or the air.  If you cough or sneeze into a tissue, throw it away immediately and wash your hands.  Stay home unless you must get medical care. Call your health care provider or local health authority before you  get medical care.  Avoid public areas. Do not take public transportation, if possible.  If you can, wear a mask if you must go out of the house or if you are in close contact with someone who is not sick. Keep your home clean  Disinfect objects and surfaces that are frequently touched every day. This may include: ? Counters and tables. ? Doorknobs and light switches. ? Sinks and faucets. ? Electronics such as phones, remote controls, keyboards, computers, and tablets.  Wash dishes in hot, soapy water or use a dishwasher. Air-dry your dishes.  Wash laundry in hot water. Prevent infecting other household members  Let healthy household members care for children and pets, if possible. If you have to care for children or pets, wash your hands often and wear a mask.  Sleep in a different bedroom or bed, if possible.  Do not share personal items, such as razors, toothbrushes, deodorant, combs, brushes, towels, and washcloths. Where to find more information Centers for Disease Control and Prevention (CDC)  Information and news updates: CardRetirement.czwww.cdc.gov/coronavirus/2019-ncov World Health Organization [facility](WHO)  Information and news updates: AffordableSalon.eswww.who.int/emergencies/diseases/novel-coronavirus-2019  Coronavirus health topic: https://thompson-craig.com/www.who.int/health-topics/coronavirus  Questions and answers on COVID-19: kruiseway.comwww.who.int/news-room/q-a-detail/q-a-coronaviruses  Global tracker: who.sprinklr.com American Academy of Pediatrics (AAP)  Information for families: www.healthychildren.org/English/health-issues/conditions/chest-lungs/Pages/2019-Novel-Coronavirus.aspx The coronavirus situation is  changing rapidly. Check your local health authority website or the CDC and [facility] websites for updates and news. When should I contact a health care provider?  Contact your health care provider if you have symptoms of an infection, such as fever or cough, and you: ? Have been near anyone who is known to have coronavirus  disease. ? Have come into contact with a person who is suspected to have coronavirus disease. ? Have traveled outside of the country. When should I get emergency medical care?  Get help right away by calling your local emergency services (911 in the [place] if you have: ? Trouble breathing. ? Pain or pressure in your chest. ? Confusion. ? Blue-tinged lips and fingernails. ? Difficulty waking from sleep. ? Symptoms that get worse. Let the emergency medical personnel know if you think you have coronavirus disease. Summary  A new respiratory virus is spreading from person to person and causing COVID-19 (coronavirus disease).  The virus that causes COVID-19 appears to spread easily. It spreads from one person to another through droplets from coughing or sneezing.  Older adults and those with chronic diseases are at higher risk of disease. If you are at higher risk for complications, take extra precautions.  There is currently no vaccine to prevent coronavirus disease. There are no medicines, such as antibiotics or antivirals, to treat the virus.  You can protect yourself and your family by washing your hands often, avoiding touching your face, and covering your coughs and sneezes. This information is not intended to replace advice given to you by your health care provider. Make sure you discuss any questions you have with your health care provider. Document Released: 01/02/2019 Document Revised: 01/02/2019 Document Reviewed: 01/02/2019 Elsevier Patient Education  2020 [employer].

## 2020-03-22 IMAGING — CR CHEST - 2 VIEW
2 series · 2 of 2 positions shown · non-contrast
Comparison: 08/24/2010

CLINICAL DATA: Chest pain

EXAM:
CHEST - 2 VIEW

[chest pa]
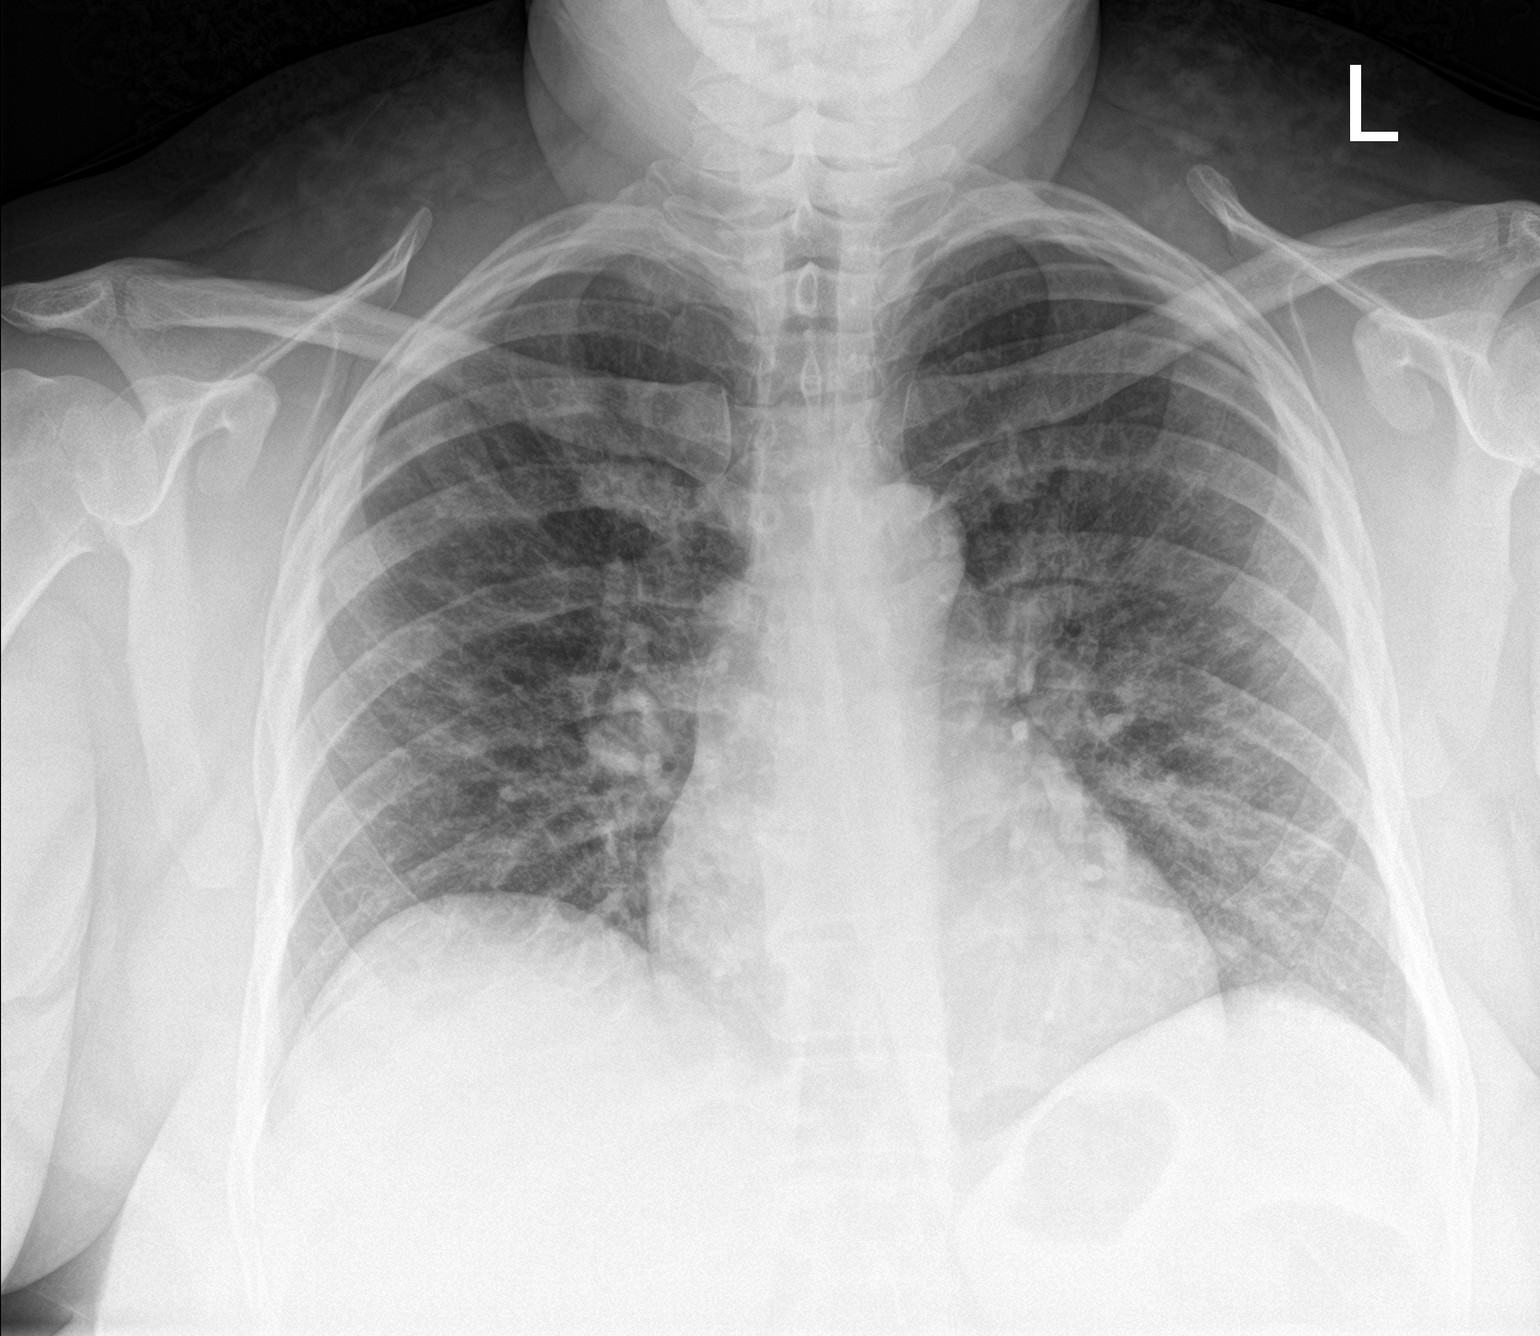

[chest lat]
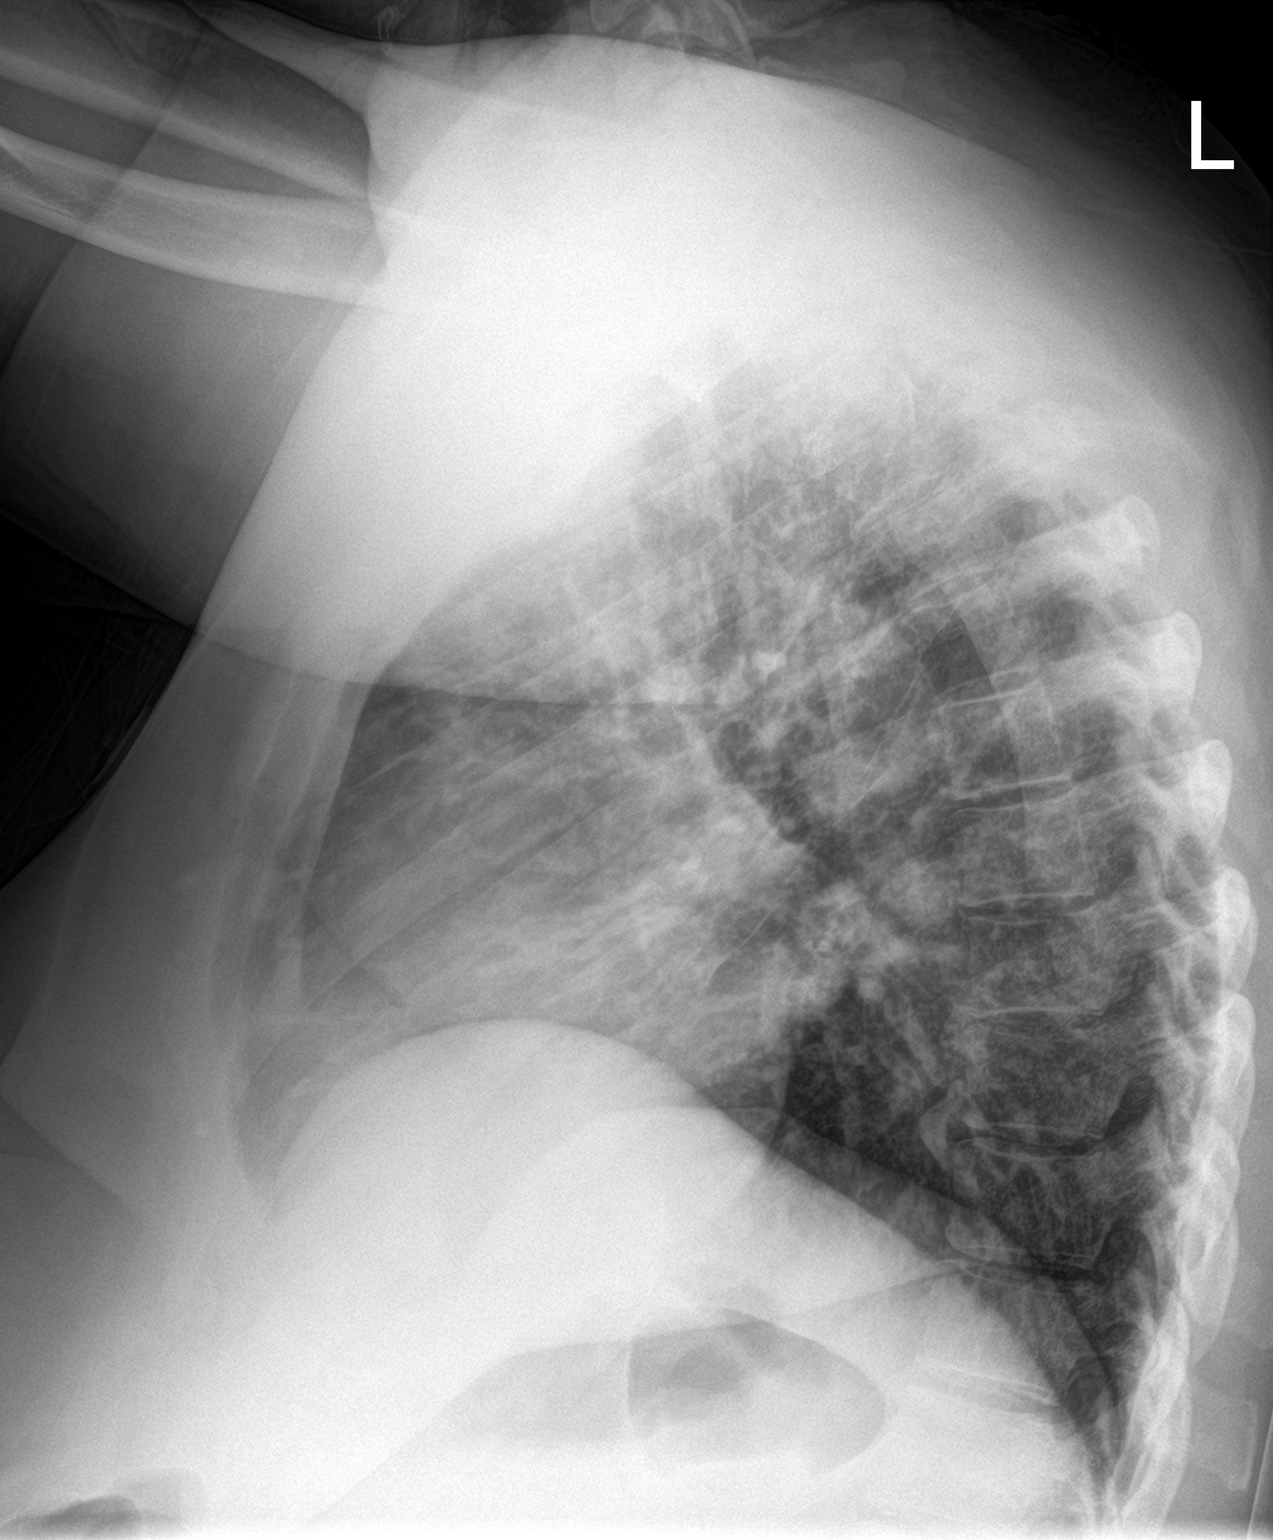

[2 of 2 positions shown; findings below may reference images not displayed]

FINDINGS: No consolidation or effusion. Bilateral interstitial and vague
ground-glass opacity. Normal heart size. No pneumothorax.
IMPRESSION: Increased bilateral interstitial and ground-glass opacity as may be
seen with interstitial inflammatory process versus possible atypical
or viral pneumonia.

## 2023-10-03 ENCOUNTER — Other Ambulatory Visit: Payer: Self-pay

## 2023-10-03 ENCOUNTER — Emergency Department (HOSPITAL_BASED_OUTPATIENT_CLINIC_OR_DEPARTMENT_OTHER)
Admission: EM | Admit: 2023-10-03 | Discharge: 2023-10-03 | Disposition: A | Discharge status: Home / Self Care | Payer: 59 | Attending: Emergency Medicine | Admitting: Emergency Medicine

## 2023-10-03 ENCOUNTER — Emergency Department (HOSPITAL_BASED_OUTPATIENT_CLINIC_OR_DEPARTMENT_OTHER): Payer: 59

## 2023-10-03 DIAGNOSIS — D649 Anemia, unspecified: Secondary | ICD-10-CM | POA: Insufficient documentation

## 2023-10-03 DIAGNOSIS — B349 Viral infection, unspecified: Secondary | ICD-10-CM | POA: Insufficient documentation

## 2023-10-03 DIAGNOSIS — R1084 Generalized abdominal pain: Secondary | ICD-10-CM | POA: Insufficient documentation

## 2023-10-03 DIAGNOSIS — Z20822 Contact with and (suspected) exposure to covid-19: Secondary | ICD-10-CM | POA: Insufficient documentation

## 2023-10-03 DIAGNOSIS — R0602 Shortness of breath: Secondary | ICD-10-CM | POA: Diagnosis present

## 2023-10-03 LAB — COMPREHENSIVE METABOLIC PANEL
ALT: 11 U/L (ref 0–44)
AST: 11 U/L — ABNORMAL LOW (ref 15–41)
Albumin: 3.9 g/dL (ref 3.5–5.0)
Alkaline Phosphatase: 61 U/L (ref 38–126)
Anion gap: 9 (ref 5–15)
BUN: 11 mg/dL (ref 6–20)
CO2: 25 mmol/L (ref 22–32)
Calcium: 8.3 mg/dL — ABNORMAL LOW (ref 8.9–10.3)
Chloride: 98 mmol/L (ref 98–111)
Creatinine, Ser: 0.99 mg/dL (ref 0.44–1.00)
GFR, Estimated: >60 mL/min (ref >60)
Glucose, Bld: 102 mg/dL — ABNORMAL HIGH (ref 70–99)
Potassium: 3.7 mmol/L (ref 3.5–5.1)
Sodium: 132 mmol/L — ABNORMAL LOW (ref 135–145)
Total Bilirubin: 0.2 mg/dL (ref 0.0–1.2)
Total Protein: 7.4 g/dL (ref 6.5–8.1)

## 2023-10-03 LAB — URINALYSIS, ROUTINE W REFLEX MICROSCOPIC
Bilirubin Urine: NEGATIVE
Glucose, UA: NEGATIVE mg/dL
Ketones, ur: NEGATIVE mg/dL
Leukocytes,Ua: NEGATIVE
Nitrite: NEGATIVE
Protein, ur: NEGATIVE mg/dL
Specific Gravity, Urine: 1.009 (ref 1.005–1.030)
pH: 6.5 (ref 5.0–8.0)

## 2023-10-03 LAB — FERRITIN: Ferritin: 15 ng/mL (ref 11–307)

## 2023-10-03 LAB — PREGNANCY, URINE: Preg Test, Ur: NEGATIVE

## 2023-10-03 LAB — LIPASE, BLOOD: Lipase: 22 U/L (ref 11–51)

## 2023-10-03 LAB — TROPONIN I (HIGH SENSITIVITY)
Troponin I (High Sensitivity): 5 ng/L (ref <18)
Troponin I (High Sensitivity): 4 ng/L (ref <18)

## 2023-10-03 LAB — CBC
HCT: 31.5 % — ABNORMAL LOW (ref 36.0–46.0)
Hemoglobin: 9.2 g/dL — ABNORMAL LOW (ref 12.0–15.0)
MCH: 20.2 pg — ABNORMAL LOW (ref 26.0–34.0)
MCHC: 29.2 g/dL — ABNORMAL LOW (ref 30.0–36.0)
MCV: 69.1 fL — ABNORMAL LOW (ref 80.0–100.0)
Platelets: 440 10*3/uL — ABNORMAL HIGH (ref 150–400)
RBC: 4.56 MIL/uL (ref 3.87–5.11)
RDW: 19.4 % — ABNORMAL HIGH (ref 11.5–15.5)
WBC: 6.6 10*3/uL (ref 4.0–10.5)
nRBC: 0 % (ref 0.0–0.2)

## 2023-10-03 LAB — IRON AND TIBC
Iron: 11 ug/dL — ABNORMAL LOW (ref 28–170)
Saturation Ratios: 3 % — ABNORMAL LOW (ref 10.4–31.8)
TIBC: 360 ug/dL (ref 250–450)
UIBC: 349 ug/dL

## 2023-10-03 LAB — BRAIN NATRIURETIC PEPTIDE: B Natriuretic Peptide: 17.2 pg/mL (ref 0.0–100.0)

## 2023-10-03 LAB — HCG, SERUM, QUALITATIVE: Preg, Serum: NEGATIVE

## 2023-10-03 LAB — RESP PANEL BY RT-PCR (RSV, FLU A&B, COVID)  RVPGX2
Influenza A by PCR: NEGATIVE
Influenza B by PCR: NEGATIVE
Resp Syncytial Virus by PCR: NEGATIVE
SARS Coronavirus 2 by RT PCR: NEGATIVE

## 2023-10-03 MED ORDER — IOHEXOL 300 MG/ML  SOLN
100.0000 mL | Freq: Once | INTRAMUSCULAR | Status: AC | PRN
  Administered 2023-10-03: 100 mL via INTRAVENOUS

## 2023-10-03 MED ORDER — ONDANSETRON HCL 4 MG/2ML IJ SOLN
4.0000 mg | Freq: Once | INTRAMUSCULAR | Status: AC
  Administered 2023-10-03: 4 mg via INTRAVENOUS
  Filled 2023-10-03: qty 2

## 2023-10-03 MED ORDER — FENTANYL CITRATE PF 50 MCG/ML IJ SOSY
50.0000 ug | PREFILLED_SYRINGE | Freq: Once | INTRAMUSCULAR | Status: AC
  Administered 2023-10-03: 50 ug via INTRAVENOUS
  Filled 2023-10-03: qty 1

## 2023-10-03 MED ORDER — LACTATED RINGERS IV BOLUS
1000.0000 mL | Freq: Once | INTRAVENOUS | Status: AC
  Administered 2023-10-03: 1000 mL via INTRAVENOUS

## 2023-10-03 MED ORDER — ACETAMINOPHEN 325 MG PO TABS
650.0000 mg | ORAL_TABLET | Freq: Four times a day (QID) | ORAL | Status: DC | PRN
  Administered 2023-10-03: 650 mg via ORAL
  Filled 2023-10-03: qty 2

## 2023-10-03 NOTE — Discharge Instructions (Addendum)
 Your laboratory evaluation, CT and x-ray imaging was overall reassuring.  Your symptoms are consistent with a likely viral syndrome.  Recommend continued outpatient supportive care, continued oral rehydration.  Follow-up with your primary care provider to ensure resolution, return for any severe worsening symptoms.  You were found to be anemic however your hemoglobin is improved compared to previous measurements. Your CT scan had the following indicental findings: IMPRESSION:  1. No acute CT findings in the abdomen or pelvis.  2. Diverticulosis without evidence of diverticulitis.  3. Hepatomegaly with mild steatosis.  4. 1.1 cm right adrenal nodule, probable benign adenoma. Recommend  follow-up adrenal washout CT in 1 year. If stable for ? 1 year, no  further follow-up imaging. JACR 2017 Aug; 14(8):1038-44, JCAT 2016  Mar-Apr; 40(2):194-200, Urol J 2006 Spring; 3(2):71-4.  5. Small umbilical fat hernia.  6. Right-sided sclerosis at the pubic bone.

## 2023-10-03 NOTE — ED Triage Notes (Signed)
 Pt here with body aches, chills, generalized pain. Nausea and diarrhea. Started Friday. OTC meds not helping. Pt having SOB since being sickand CP. Cp is left sided and radiates to arm. CP feels stabbing and pt does not have it right now. Pt has not taken temp at home. Denies cough

## 2023-10-03 NOTE — ED Provider Notes (Signed)
 [place] EMERGENCY DEPARTMENT AT [facility] Provider Note   CSN: [id number] Arrival date & time: 10/03/23  0102     History  Chief Complaint  Patient presents with   Shortness of Breath    Elizabeth [name] is a 48 y.o. female.   Shortness of Breath Associated symptoms: abdominal pain, chest pain, fever and vomiting      48 year old female presenting to the emergency department with chills, body aches, generalized abdominal pain.  The patient also endorses nausea and 1 episode of NBNB emesis in addition to loose stools.  Symptoms started on Friday.  She endorses mild shortness of breath, denies cough, had a brief episode of stabbing chest pain after vomiting, described as left-sided.  Currently chest pain-free.   Home Medications Prior to Admission medications   Medication Sig Start Date End Date Taking? Authorizing Provider  acetaminophen  (TYLENOL ) 325 MG tablet Take 2 tablets (650 mg total) by mouth every 6 (six) hours as needed for mild pain, fever or headache. 03/21/19   Danton Reyes DASEN, MD  amLODipine  (NORVASC ) 10 MG tablet Take 1 tablet (10 mg total) by mouth daily. 08/15/16   Little, Vernell Search, MD      Allergies    Patient has no known allergies.    Review of Systems   Review of Systems  Constitutional:  Positive for chills and fever.  Respiratory:  Positive for shortness of breath.   Cardiovascular:  Positive for chest pain.  Gastrointestinal:  Positive for abdominal pain, diarrhea, nausea and vomiting.  All other systems reviewed and are negative.   Physical Exam Updated Vital Signs BP 125/74   Pulse 66   Temp 99.4 F (37.4 C) (Oral)   Resp 16   Ht 5' 6 (1.676 m)   Wt 98 kg   LMP 09/12/2023   SpO2 97%   BMI 34.86 kg/m  Physical Exam Vitals and nursing note reviewed.  Constitutional:      General: She is not in acute distress.    Appearance: She is well-developed. She is obese.  HENT:     Head: Normocephalic and atraumatic.   Eyes:     Conjunctiva/sclera: Conjunctivae normal.  Cardiovascular:     Rate and Rhythm: Normal rate and regular rhythm.  Pulmonary:     Effort: Pulmonary effort is normal. No respiratory distress.     Breath sounds: Normal breath sounds.  Abdominal:     Palpations: Abdomen is soft.     Tenderness: There is abdominal tenderness in the left lower quadrant. There is no guarding or rebound.  Musculoskeletal:        General: No swelling.     Cervical back: Neck supple.  Skin:    General: Skin is warm and dry.     Capillary Refill: Capillary refill takes less than 2 seconds.  Neurological:     Mental Status: She is alert.  Psychiatric:        Mood and Affect: Mood normal.     ED Results / Procedures / Treatments   Labs (all labs ordered are listed, but only abnormal results are displayed) Labs Reviewed  COMPREHENSIVE METABOLIC PANEL - Abnormal; Notable for the following components:      Result Value   Sodium 132 (*)    Glucose, Bld 102 (*)    Calcium 8.3 (*)    AST 11 (*)    All other components within normal limits  CBC - Abnormal; Notable for the following components:   Hemoglobin 9.2 (*)  HCT 31.5 (*)    MCV 69.1 (*)    MCH 20.2 (*)    MCHC 29.2 (*)    RDW 19.4 (*)    Platelets 440 (*)    All other components within normal limits  URINALYSIS, ROUTINE W REFLEX MICROSCOPIC - Abnormal; Notable for the following components:   Hgb urine dipstick TRACE (*)    Bacteria, UA RARE (*)    All other components within normal limits  RESP PANEL BY RT-PCR (RSV, FLU A&B, COVID)  RVPGX2  PREGNANCY, URINE  FERRITIN  BRAIN NATRIURETIC PEPTIDE  LIPASE, BLOOD  HCG, SERUM, QUALITATIVE  IRON AND TIBC  TROPONIN I (HIGH SENSITIVITY)  TROPONIN I (HIGH SENSITIVITY)    EKG EKG Interpretation Date/Time:  Monday October 03 2023 01:21:43 EST Ventricular Rate:  79 PR Interval:  130 QRS Duration:  80 QT Interval:  368 QTC Calculation: 421 R Axis:   16  Text Interpretation: Normal  sinus rhythm Nonspecific T wave abnormality Abnormal ECG Confirmed by Jerrol Agent ([id number]) on 10/03/2023 2:54:40 AM  Radiology CT ABDOMEN PELVIS W CONTRAST Result Date: 10/03/2023 CLINICAL DATA:  Left lower quadrant pain, nausea, and diarrhea onset 3 days ago. EXAM: CT ABDOMEN AND PELVIS WITH CONTRAST TECHNIQUE: Multidetector CT imaging of the abdomen and pelvis was performed using the standard protocol following bolus administration of intravenous contrast. RADIATION DOSE REDUCTION: This exam was performed according to the departmental dose-optimization program which includes automated exposure control, adjustment of the mA and/or kV according to patient size and/or use of iterative reconstruction technique. CONTRAST:  OMNIPAQUE  IOHEXOL  300 MG/ML  SOLN COMPARISON:  None Available. FINDINGS: Lower chest: Lung bases are clear of infiltrates. There is mild elevation of right hemidiaphragm. The cardiac size is normal. Hepatobiliary: The liver is 22 cm in length with mild steatosis. There is no mass enhancement. The gallbladder and bile ducts are unremarkable. Pancreas: No abnormality. Spleen: No abnormality. Adrenals/Urinary Tract: There is a 1.1 cm right adrenal nodule, Hounsfield density is 36. Left adrenal gland and the bilateral renal cortex are unremarkable. There is no urinary stone or obstruction. The bladder is unremarkable for the degree of distention. Stomach/Bowel: No dilatation or wall thickening. An appendix is not seen in this patient. There is scattered diverticulosis without findings of acute diverticulitis or colitis. Vascular/Lymphatic: No significant vascular findings are present. No enlarged abdominal or pelvic lymph nodes. Reproductive: Elongate slightly prominent uterus, ventral C-section scar without other focal abnormality. The ovaries are unremarkable with incidentally seen 2.8 cm dominant follicle on the left. Other: Pelvic phleboliths. No free fluid. Small umbilical fat hernia. No  incarcerated hernia. Musculoskeletal: Mild degenerative change thoracic spine. Right-sided sclerosis at the pubic bone. No acute or other significant osseous findings. IMPRESSION: 1. No acute CT findings in the abdomen or pelvis. 2. Diverticulosis without evidence of diverticulitis. 3. Hepatomegaly with mild steatosis. 4. 1.1 cm right adrenal nodule, probable benign adenoma. Recommend follow-up adrenal washout CT in 1 year. If stable for ? 1 year, no further follow-up imaging. JACR 2017 Aug; 14(8):1038-44, JCAT 2016 Mar-Apr; 40(2):194-200, Urol J 2006 Spring; 3(2):71-4. 5. Small umbilical fat hernia. 6. Right-sided sclerosis at the pubic bone. Electronically Signed   By: Francis Quam M.D.   On: 10/03/2023 05:48   DG Chest Port 1 View Result Date: 10/03/2023 CLINICAL DATA:  Shortness of breath, body aches, chills, pain EXAM: PORTABLE CHEST 1 VIEW COMPARISON:  07/09/2020 FINDINGS: Lungs are clear.  No pleural effusion or pneumothorax. The heart is normal in size. IMPRESSION: No  acute cardiopulmonary disease. Electronically Signed   By: Pinkie Pebbles M.D.   On: 10/03/2023 02:50    Procedures Procedures    Medications Ordered in ED Medications  acetaminophen  (TYLENOL ) tablet 650 mg (650 mg Oral Given 10/03/23 0134)  lactated ringers  bolus 1,000 mL (0 mLs Intravenous Stopped 10/03/23 0532)  ondansetron  (ZOFRAN ) injection 4 mg (4 mg Intravenous Given 10/03/23 0406)  fentaNYL  (SUBLIMAZE ) injection 50 mcg (50 mcg Intravenous Given 10/03/23 0405)  iohexol  (OMNIPAQUE ) 300 MG/ML solution 100 mL (100 mLs Intravenous Contrast Given 10/03/23 0502)    ED Course/ Medical Decision Making/ A&P                                 Medical Decision Making Amount and/or Complexity of Data Reviewed Labs: ordered. Radiology: ordered.  Risk OTC drugs. Prescription drug management.    48 year old female presenting to the emergency department with chills, body aches, generalized abdominal pain.  The patient also  endorses nausea and 1 episode of NBNB emesis in addition to loose stools.  Symptoms started on Friday.  She endorses mild shortness of breath, denies cough, had a brief episode of stabbing chest pain after vomiting, described as left-sided.  Currently chest pain-free.   On arrival, the patient was febrile to 102.7, not tachycardic or tachypneic, saturating well on room air, hemodynamically stable.  Physical exam revealed clear lungs, left lower quadrant tenderness to palpation, no rebound or guarding.  Considered gastroenteritis, diverticulitis, other acute intra-abdominal infection such as appendicitis.  Lower concern for small bowel obstruction.  Regarding the patient's brief episode of chest discomfort, suspect likely gastritis or GERD, low concern for ACS or PE, patient is PERC negative.  IV access was obtained and the patient was administered IV fentanyl , IV Zofran  and IV fluid bolus.  EKG: Sinus rhythm, nonspecific T wave changes, no STEMI.  Chest x-ray: No acute cardiopulmonary disease  Laboratory evaluation: CBC without a leukocytosis, stable anemia at 9.2 which is microcytic and appears to be at the patient's baseline, urinalysis negative for UTI, CMP without significant electrolyte abnormality, normal LFTs and creatinine, COVID and influenza and RSV PCR testing negative. Repeat troponin was negative.  CT Abdomen Pelvis: IMPRESSION:  1. No acute CT findings in the abdomen or pelvis.  2. Diverticulosis without evidence of diverticulitis.  3. Hepatomegaly with mild steatosis.  4. 1.1 cm right adrenal nodule, probable benign adenoma. Recommend  follow-up adrenal washout CT in 1 year. If stable for ? 1 year, no  further follow-up imaging. JACR 2017 Aug; 14(8):1038-44, JCAT 2016  Mar-Apr; 40(2):194-200, Urol J 2006 Spring; 3(2):71-4.  5. Small umbilical fat hernia.  6. Right-sided sclerosis at the pubic bone.   Patient laboratory evaluation overall reassuring, no evidence of elevated  white blood cell count, cardiac troponins x 2 negative, chest x-ray without evidence of pneumonia and CT of the abdomen pelvis without evidence of acute infection or other acute abnormality.  Patient's symptoms are likely due to a viral syndrome.  COVID, flu, RSV PCR testing was negative.  Patient was tolerating oral intake, feeling symptomatically improved following the above interventions.  Advised continued outpatient supportive care, continued Tylenol , ibuprofen, oral rehydration, return precautions provided in the event it is any severe worsening symptoms.  Advised outpatient follow-up regarding her chronic anemia.   Final Clinical Impression(s) / ED Diagnoses Final diagnoses:  Acute viral syndrome  Anemia, unspecified type    Rx / DC Orders ED Discharge Orders  None         Jerrol Agent, MD 10/03/23 [phone]

## 2023-10-03 NOTE — ED Notes (Signed)
 Pt transported to CT ?
